# Patient Record
Sex: Male | Born: 1960
Health system: Southern US, Community
[De-identification: ages and names within clinical notes are randomized; demographics above are authoritative.]

## PROBLEM LIST (undated history)

## (undated) DIAGNOSIS — Z789 Other specified health status: Secondary | ICD-10-CM

## (undated) HISTORY — PX: NO PAST SURGERIES: SHX2092

---

## 2007-03-18 ENCOUNTER — Emergency Department: Payer: Self-pay | Admitting: Internal Medicine

## 2007-03-24 ENCOUNTER — Emergency Department: Payer: Self-pay

## 2008-08-07 ENCOUNTER — Ambulatory Visit: Payer: Self-pay | Admitting: Family Medicine

## 2009-07-16 IMAGING — CR DG LUMBAR SPINE 2-3V
1 series · 3 of 3 positions shown · non-contrast
Comparison: none

REASON FOR EXAM: pain
COMMENTS:

[Series 2: view not recorded · 0.17mm/px · 3 of 3 slices shown]
[im 1/3]
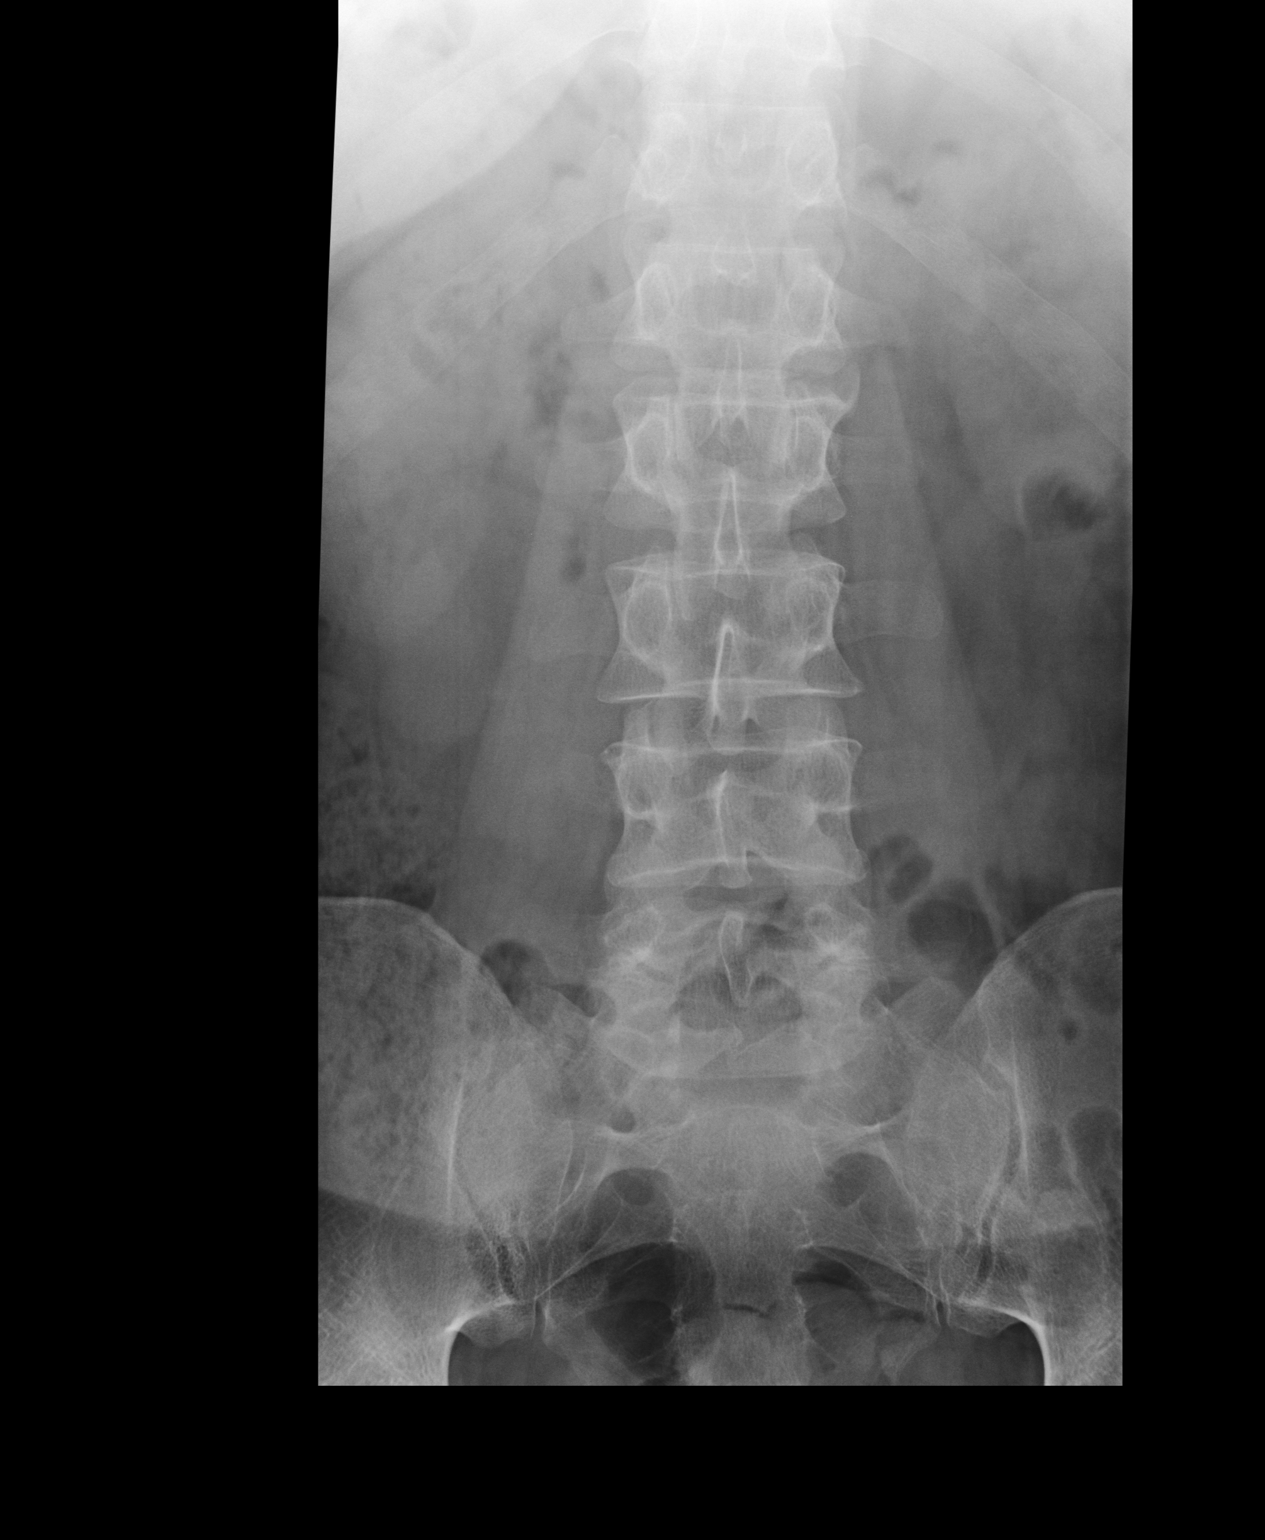
[im 2/3]
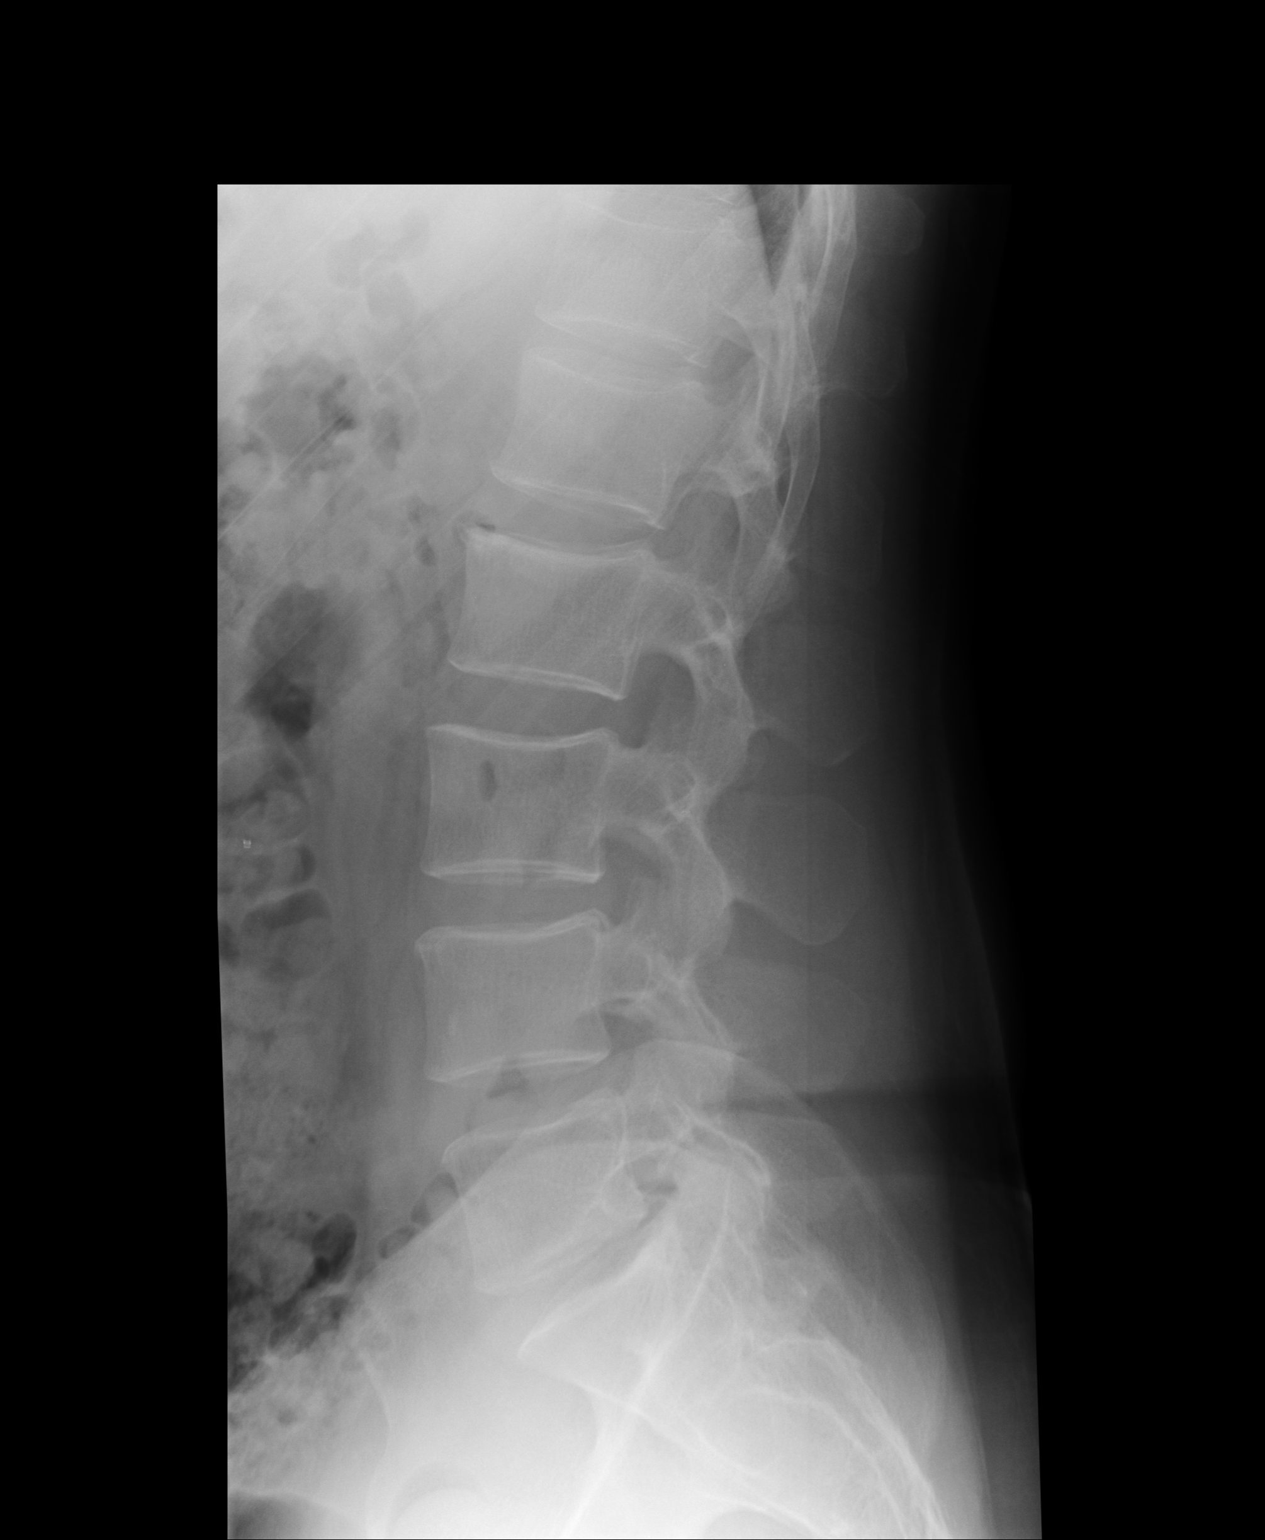
[im 3/3]
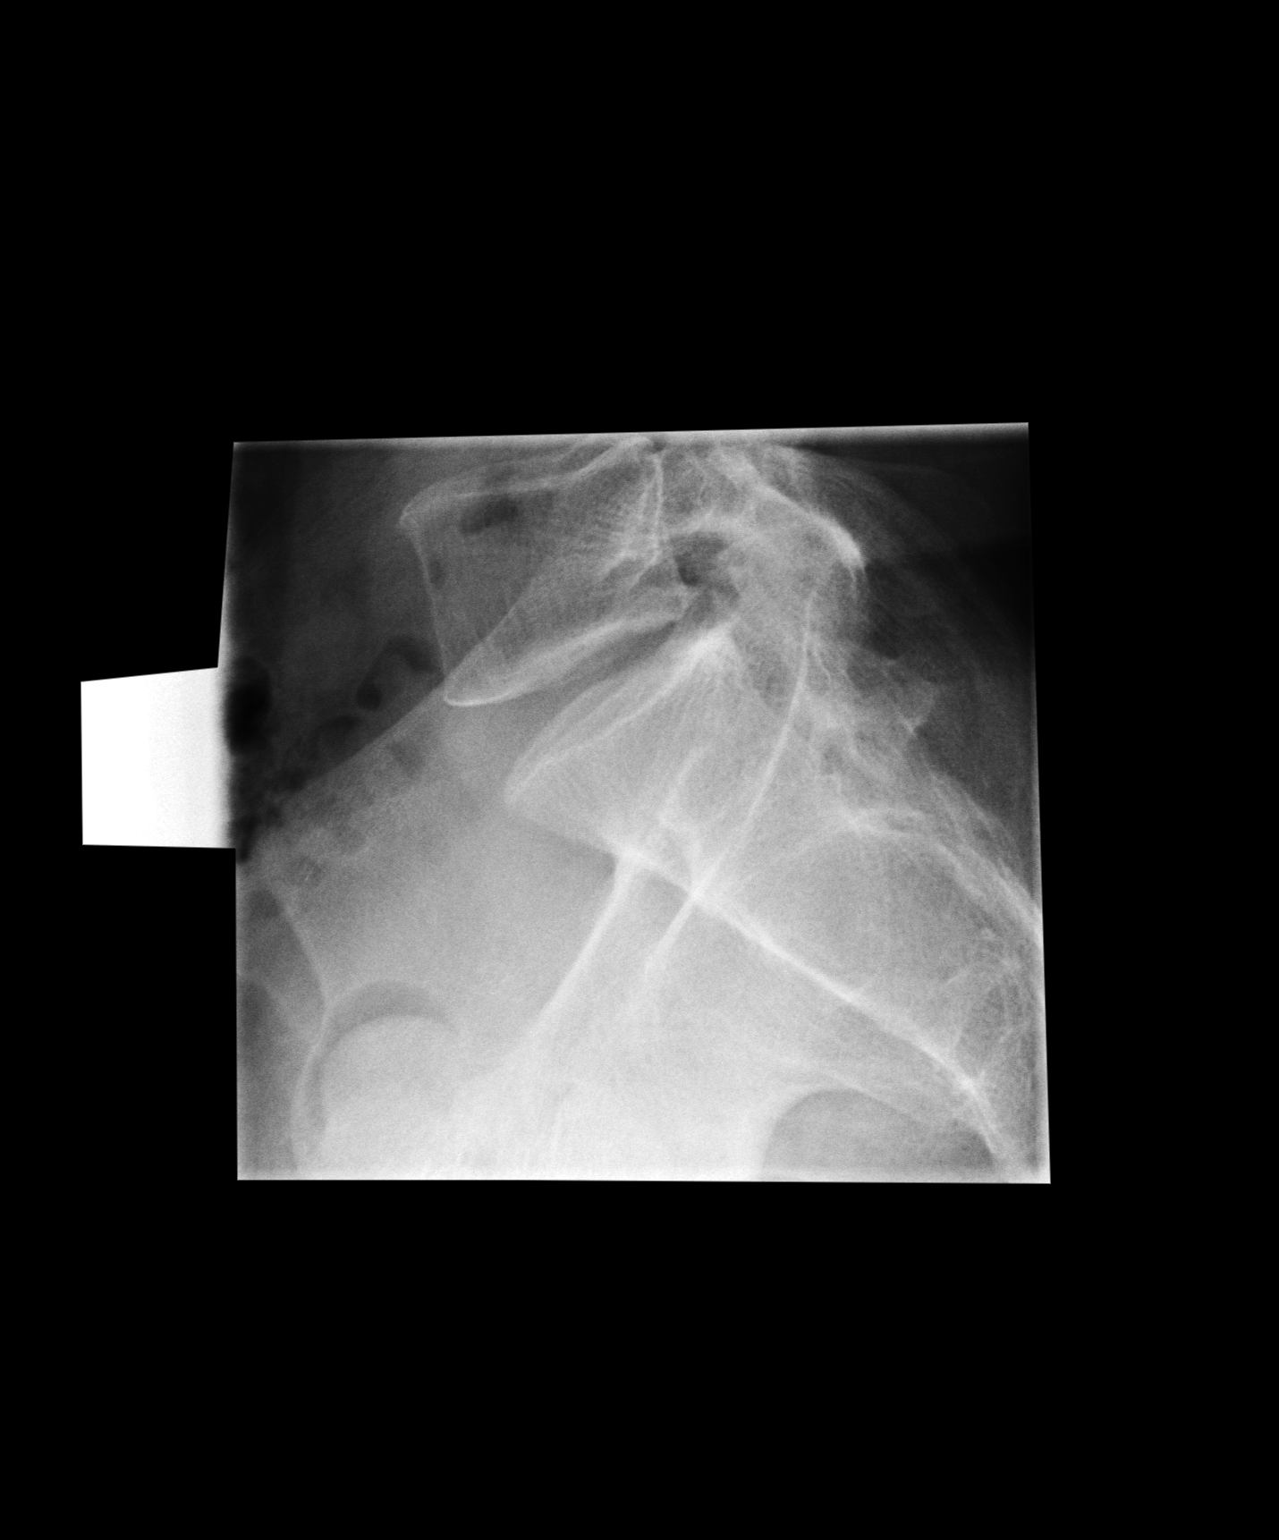

[3 of 3 positions shown; findings below may reference images not displayed]

PROCEDURE:     KDR - KDXR LUMBAR SPINE AP AND LATERAL  - August 07, 2008 [DATE]

RESULT:     The vertebral body heights and the intervertebral disc spaces
are well-maintained. The vertebral alignment is normal. The pedicles are
bilaterally intact. No lytic or blastic lesions are seen. There is slight
degenerative spurring anteriorly at L2.
IMPRESSION: 1. No acute changes are identified.

## 2014-03-30 LAB — LIPID PANEL
Cholesterol: 235 mg/dL — AB (ref 0–200)
HDL: 35 mg/dL (ref 35–70)
LDL Cholesterol: 170 mg/dL
Triglycerides: 152 mg/dL (ref 40–160)

## 2014-03-30 LAB — BASIC METABOLIC PANEL
BUN: 14 mg/dL (ref 4–21)
CREATININE: 1.2 mg/dL (ref <=1.3)
Glucose: 81 mg/dL
POTASSIUM: 4.3 mmol/L (ref 3.4–5.3)
Sodium: 143 mmol/L (ref 137–147)

## 2015-10-13 ENCOUNTER — Encounter: Payer: Self-pay | Admitting: Emergency Medicine

## 2015-10-13 ENCOUNTER — Emergency Department
Admission: EM | Admit: 2015-10-13 | Discharge: 2015-10-13 | Disposition: A | Discharge status: Home / Self Care | Payer: Worker's Compensation | Attending: Emergency Medicine | Admitting: Emergency Medicine

## 2015-10-13 DIAGNOSIS — S61012A Laceration without foreign body of left thumb without damage to nail, initial encounter: Secondary | ICD-10-CM | POA: Diagnosis not present

## 2015-10-13 DIAGNOSIS — W231XXA Caught, crushed, jammed, or pinched between stationary objects, initial encounter: Secondary | ICD-10-CM | POA: Diagnosis not present

## 2015-10-13 DIAGNOSIS — Y9289 Other specified places as the place of occurrence of the external cause: Secondary | ICD-10-CM | POA: Insufficient documentation

## 2015-10-13 DIAGNOSIS — Y990 Civilian activity done for income or pay: Secondary | ICD-10-CM | POA: Insufficient documentation

## 2015-10-13 DIAGNOSIS — Y9389 Activity, other specified: Secondary | ICD-10-CM | POA: Insufficient documentation

## 2015-10-13 MED ORDER — CEPHALEXIN 500 MG PO CAPS: 500.0000 mg | ORAL_CAPSULE | Freq: Three times a day (TID) | ORAL | Status: DC

## 2015-10-13 NOTE — Discharge Instructions (Signed)
Stitches, Staples, or Adhesive Wound Closure  °Health care providers use stitches (sutures), staples, and certain glue (skin adhesives) to hold skin together while it heals (wound closure). You may need this treatment after you have surgery or if you cut your skin accidentally. These methods help your skin to heal more quickly and make it less likely that you will have a scar. A wound may take several months to heal completely.  °The type of wound you have determines when your wound gets closed. In most cases, the wound is closed as soon as possible (primary skin closure). Sometimes, closure is delayed so the wound can be cleaned and allowed to heal naturally. This reduces the chance of infection. Delayed closure may be needed if your wound:  °Is caused by a bite.  °Happened more than 6 hours ago.  °Involves loss of skin or the tissues under the skin.  °Has dirt or debris in it that cannot be removed.  °Is infected. °WHAT ARE THE DIFFERENT KINDS OF WOUND CLOSURES?  °There are many options for wound closure. The one that your health care provider uses depends on how deep and how large your wound is.  °Adhesive Glue  °To use this type of glue to close a wound, your health care provider holds the edges of the wound together and paints the glue on the surface of your skin. You may need more than one layer of glue. Then the wound may be covered with a light bandage (dressing).  °This type of skin closure may be used for small wounds that are not deep (superficial). Using glue for wound closure is less painful than other methods. It does not require a medicine that numbs the area (local anesthetic). This method also leaves nothing to be removed. Adhesive glue is often used for children and on facial wounds.  °Adhesive glue cannot be used for wounds that are deep, uneven, or bleeding. It is not used inside of a wound.  °Adhesive Strips  °These strips are made of sticky (adhesive), porous paper. They are applied across your  skin edges like a regular adhesive bandage. You leave them on until they fall off.  °Adhesive strips may be used to close very superficial wounds. They may also be used along with sutures to improve the closure of your skin edges.  °Sutures  °Sutures are the oldest method of wound closure. Sutures can be made from natural substances, such as silk, or from synthetic materials, such as nylon and steel. They can be made from a material that your body can break down as your wound heals (absorbable), or they can be made from a material that needs to be removed from your skin (nonabsorbable). They come in many different strengths and sizes.  °Your health care provider attaches the sutures to a steel needle on one end. Sutures can be passed through your skin, or through the tissues beneath your skin. Then they are tied and cut. Your skin edges may be closed in one continuous stitch or in separate stitches.  °Sutures are strong and can be used for all kinds of wounds. Absorbable sutures may be used to close tissues under the skin. The disadvantage of sutures is that they may cause skin reactions that lead to infection. Nonabsorbable sutures need to be removed.  °Staples  °When surgical staples are used to close a wound, the edges of your skin on both sides of the wound are brought close together. A staple is placed across the wound, and   an instrument secures the edges together. Staples are often used to close surgical cuts (incisions).  °Staples are faster to use than sutures, and they cause less skin reaction. Staples need to be removed using a tool that bends the staples away from your skin.  °HOW DO I CARE FOR MY WOUND CLOSURE?  °Take medicines only as directed by your health care provider.  °If you were prescribed an antibiotic medicine for your wound, finish it all even if you start to feel better.  °Use ointments or creams only as directed by your health care provider.  °Wash your hands with soap and water before and  after touching your wound.  °Do not soak your wound in water. Do not take baths, swim, or use a hot tub until your health care provider approves.  °Ask your health care provider when you can start showering. Cover your wound if directed by your health care provider.  °Do not take out your own sutures or staples.  °Do not pick at your wound. Picking can cause an infection.  °Keep all follow-up visits as directed by your health care provider. This is important. °HOW LONG WILL I HAVE MY WOUND CLOSURE?  °Leave adhesive glue on your skin until the glue peels away.  °Leave adhesive strips on your skin until the strips fall off.  °Absorbable sutures will dissolve within several days.  °Nonabsorbable sutures and staples must be removed. The location of the wound will determine how long they stay in. This can range from several days to a couple of weeks. °WHEN SHOULD I SEEK HELP FOR MY WOUND CLOSURE?  °Contact your health care provider if:  °You have a fever.  °You have chills.  °You have drainage, redness, swelling, or pain at your wound.  °There is a bad smell coming from your wound.  °The skin edges of your wound start to separate after your sutures have been removed.  °Your wound becomes thick, raised, and darker in color after your sutures come out (scarring). °This information is not intended to replace advice given to you by your health care provider. Make sure you discuss any questions you have with your health care provider.  °Document Released: 04/22/2001 Document Revised: 08/18/2014 Document Reviewed: 01/04/2014  °Elsevier Interactive Patient Education ©2016 Elsevier Inc[employer]  ° °

## 2015-10-13 NOTE — ED Provider Notes (Signed)
[facility] Emergency Department Provider Note  ____________________________________________  Time seen: Approximately 3:43 PM  I have reviewed the triage vital signs and the nursing notes.   HISTORY  Chief Complaint Laceration    HPI Kevin Terry is a 55 y.o. male , NAD, reports to the emergency department after smashing his left thumb in a new machine while at work. States she lacerated the tip of his thumb. States he immediately washed the area and irrigated with water while working. Has applied dressing and has been applying pressure. Notes his last tetanus was within the last 4 years. Also states he would prefer glue or Steri-Strips to be applied as he has a high likelihood of passing out with any needles or suturing. Also notes the same with increased amounts of blood. Denies any wheezing S or dizziness at this time. On this, weakness, tingling of the left thumb.   History reviewed. No pertinent past medical history.  There are no active problems to display for this patient.   History reviewed. No pertinent past surgical history.  Current Outpatient Rx  Name  Route  Sig  Dispense  Refill  . cephALEXin (KEFLEX) 500 MG capsule   Oral   Take 1 capsule (500 mg total) by mouth 3 (three) times daily.   21 capsule   0     Allergies Review of patient's allergies indicates no known allergies.  No family history on file.  Social History Social History  Substance Use Topics  . Smoking status: Never Smoker   . Smokeless tobacco: None  . Alcohol Use: No     Review of Systems  Constitutional: No fever/chills Eyes: No visual changes.  Cardiovascular: No chest pain. Respiratory: No shortness of breath. No wheezing.  Gastrointestinal: No abdominal pain.  No nausea, vomiting.   Musculoskeletal: Pain about the left thumb at the laceration site. Specified musculoskeletal pain. Skin: Positive laceration left thumb. Negative for rash,  bruising. Neurological: Negative for headaches, focal weakness or numbness. 10-point ROS otherwise negative.  ____________________________________________   PHYSICAL EXAM:  VITAL SIGNS: ED Triage Vitals  Enc Vitals Group     BP 10/13/15 1440 122/87 mmHg     Pulse Rate 10/13/15 1440 72     Resp 10/13/15 1440 18     Temp 10/13/15 1440 97.8 F (36.6 C)     Temp src --      SpO2 10/13/15 1440 97 %     Weight 10/13/15 1440 185 lb (83.915 kg)     Height 10/13/15 1440 6' (1.829 m)     Head Cir --      Peak Flow --      Pain Score 10/13/15 1441 10     Pain Loc --      Pain Edu? --      Excl. in [place]? --     Constitutional: Alert and oriented. Well appearing and in no acute distress. Eyes: Conjunctivae are normal.  Head: Atraumatic. Cardiovascular:Good peripheral circulation. Respiratory: Normal respiratory effort without tachypnea or retractions.  Musculoskeletal: No pain to palpation about the left thumb or hand. No joint tenderness. Full range of motion of left thumb. Neurologic:  Normal speech and language. No gross focal neurologic deficits are appreciated.  Skin:  1.5cm, clean, linear laceration to the pad of the left thumb. Skin is warm, dry and intact. No rash noted. Psychiatric: Mood and affect are normal. Speech and behavior are normal. Patient exhibits appropriate insight and judgement.   ____________________________________________  LABS  None  ____________________________________________  EKG  None ____________________________________________  RADIOLOGY  None ____________________________________________    PROCEDURES  Procedure(s) performed: LACERATION REPAIR Performed by: Braxton Feathers Authorized by: Braxton Feathers Consent: Verbal consent obtained. Risks and benefits: risks, benefits and alternatives were discussed Consent given by: patient Patient identity confirmed: provided demographic data Prepped and Draped in normal sterile fashion Wound  explored  Laceration Location: pad of left thumb  Laceration Length: 1.5cm  No Foreign Bodies seen or palpated  Anesthesia: local infiltration  Local anesthetic: none  Anesthetic total: NA  Irrigation method: soak with manipulation (patient would not tolerate syringe irrigation) Amount of cleaning: standard  Skin closure: steri strips with compound tincture  Number of sutures: 3  Technique: NA  Patient tolerance: Patient tolerated the procedure well with no immediate complications.    Medications - No data to display   ____________________________________________   INITIAL IMPRESSION / ASSESSMENT AND PLAN / ED COURSE  Patient's diagnosis is consistent with laceration of left thumb. Patient will be discharged home with prescriptions for Keflex to take as prescribed. May take Tylenol as needed for pain. Work note given to excuse from work for the next 48 hours to allow Steri-Strips to adhere properly and wound to begin healing. Patient is to follow up with primary care provider or medical facility as deemed by Worker's Comp. for wound check in 2 days.  Patient is given ED precautions to return to the ED for any worsening or new symptoms.    ____________________________________________  FINAL CLINICAL IMPRESSION(S) / ED DIAGNOSES  Final diagnoses:  Laceration of left thumb, initial encounter      NEW MEDICATIONS STARTED DURING THIS VISIT:  Discharge Medication List as of 10/13/2015  4:12 PM    START taking these medications   Details  cephALEXin (KEFLEX) 500 MG capsule Take 1 capsule (500 mg total) by mouth 3 (three) times daily., Starting 10/13/2015, Until Discontinued, [place], PA-C 10/13/15 1939  Carrie Mew, MD 10/14/15 2250

## 2015-10-13 NOTE — ED Notes (Signed)
Smashed L thumb approx 1330 at work.

## 2015-10-13 NOTE — ED Notes (Signed)
Pt in for left thumb lac - workers comp. Pa in with pt for assessment, pt requesting not to have sutures, states he passes out easily

## 2015-10-13 NOTE — ED Notes (Signed)
Discussed discharge instructions, prescriptions, and follow-up care with patient. No questions or concerns at this time. Pt stable at discharge.  

## 2016-02-04 DIAGNOSIS — Z8249 Family history of ischemic heart disease and other diseases of the circulatory system: Secondary | ICD-10-CM | POA: Insufficient documentation

## 2016-08-07 DIAGNOSIS — H5212 Myopia, left eye: Secondary | ICD-10-CM | POA: Diagnosis not present

## 2016-12-22 DIAGNOSIS — M79672 Pain in left foot: Secondary | ICD-10-CM | POA: Diagnosis not present

## 2016-12-22 DIAGNOSIS — M722 Plantar fascial fibromatosis: Secondary | ICD-10-CM | POA: Diagnosis not present

## 2017-04-28 DIAGNOSIS — M722 Plantar fascial fibromatosis: Secondary | ICD-10-CM | POA: Diagnosis not present

## 2018-05-18 ENCOUNTER — Ambulatory Visit (INDEPENDENT_AMBULATORY_CARE_PROVIDER_SITE_OTHER): Payer: BLUE CROSS/BLUE SHIELD | Admitting: Family Medicine

## 2018-05-18 ENCOUNTER — Other Ambulatory Visit: Payer: Self-pay

## 2018-05-18 ENCOUNTER — Encounter: Payer: Self-pay | Admitting: Family Medicine

## 2018-05-18 VITALS — BP 122/78 | HR 66 | Temp 97.9°F | Resp 16 | Ht 69.0 in | Wt 188.4 lb

## 2018-05-18 DIAGNOSIS — Z1211 Encounter for screening for malignant neoplasm of colon: Secondary | ICD-10-CM | POA: Diagnosis not present

## 2018-05-18 DIAGNOSIS — Z125 Encounter for screening for malignant neoplasm of prostate: Secondary | ICD-10-CM

## 2018-05-18 DIAGNOSIS — Z87442 Personal history of urinary calculi: Secondary | ICD-10-CM

## 2018-05-18 DIAGNOSIS — M722 Plantar fascial fibromatosis: Secondary | ICD-10-CM

## 2018-05-18 DIAGNOSIS — Z0000 Encounter for general adult medical examination without abnormal findings: Secondary | ICD-10-CM | POA: Diagnosis not present

## 2018-05-18 NOTE — Patient Instructions (Signed)
We will call you about the lab results and the colonoscopy referral.

## 2018-05-18 NOTE — Progress Notes (Signed)
  Subjective:     Patient ID: Kevin Terry, male   DOB: 1961/03/17, 57 y.o.   MRN: [id number] Chief Complaint  Patient presents with  . Annual Exam    Patient comes in office today for his annual physical, in general he states that he feels well today and has no concerns to address. Patient reports following in "general" a healthy diet, he is not actively exercising and states that he sleeps on average 6hrs a day.    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 Has seen dermatology in the past for a skin survey and podiatry for plantar fasciitis. Defers flu vaccine.  Review of Systems General: Feeling well HEENT: regular dental visits and eye exams (reading glasses) Cardiovascular: no chest pain, shortness of breath, or palpitations GI: occasional reflux he treats with Prevacid. No change in bowel habits or blood in the stool. Wishes to schedule a colonoscopy. GU: nocturia x 0, no change in bladder habits  Psychiatric: not depressed Musculoskeletal: no joint pain. Shoe inserts control his plantar fasciitis.    Objective:   Physical Exam  Constitutional: He appears well-developed and well-nourished. No distress.  Eyes: PERRLA, EOMI Neck: no thyromegaly, tenderness or nodules, no cervical adenopathy or carotid bruits. ENT: TM's intact without inflammation; No tonsillar enlargement or exudate, Lungs: Clear Heart : RRR without murmur or gallop Abd: bowel sounds present, soft, non-tender, no organomegaly Rectal: Prostate firm and non-tender Extremities: no edema Skin: no atypical lesions noted on his back     Assessment:    1. Screening for colon cancer - Ambulatory referral to Gastroenterology  2. Annual physical exam - Lipid panel - Comprehensive metabolic panel  3. History of kidney stones  4. Plantar fasciitis, bilateral  5. Screening for prostate cancer - PSA    Plan:    Further f/u pending lab results.

## 2018-05-19 LAB — COMPREHENSIVE METABOLIC PANEL
ALBUMIN: 4.3 g/dL (ref 3.5–5.5)
ALK PHOS: 77 IU/L (ref 39–117)
ALT: 14 IU/L (ref 0–44)
AST: 13 IU/L (ref 0–40)
Albumin/Globulin Ratio: 1.8 (ref 1.2–2.2)
BILIRUBIN TOTAL: 0.5 mg/dL (ref 0.0–1.2)
BUN / CREAT RATIO: 14 (ref 9–20)
BUN: 15 mg/dL (ref 6–24)
CHLORIDE: 104 mmol/L (ref 96–106)
CO2: 23 mmol/L (ref 20–29)
Calcium: 9.5 mg/dL (ref 8.7–10.2)
Creatinine, Ser: 1.11 mg/dL (ref 0.76–1.27)
GFR calc Af Amer: 84 mL/min/{1.73_m2} (ref >59)
GFR calc non Af Amer: 73 mL/min/{1.73_m2} (ref >59)
GLUCOSE: 89 mg/dL (ref 65–99)
Globulin, Total: 2.4 g/dL (ref 1.5–4.5)
POTASSIUM: 4.4 mmol/L (ref 3.5–5.2)
Sodium: 143 mmol/L (ref 134–144)
Total Protein: 6.7 g/dL (ref 6.0–8.5)

## 2018-05-19 LAB — LIPID PANEL
CHOLESTEROL TOTAL: 207 mg/dL — AB (ref 100–199)
Chol/HDL Ratio: 5.9 ratio — ABNORMAL HIGH (ref 0.0–5.0)
HDL: 35 mg/dL — ABNORMAL LOW (ref >39)
LDL CALC: 154 mg/dL — AB (ref 0–99)
Triglycerides: 89 mg/dL (ref 0–149)
VLDL CHOLESTEROL CAL: 18 mg/dL (ref 5–40)

## 2018-05-19 LAB — PSA: PROSTATE SPECIFIC AG, SERUM: 0.9 ng/mL (ref 0.0–4.0)

## 2018-05-27 ENCOUNTER — Other Ambulatory Visit: Payer: Self-pay

## 2018-05-27 ENCOUNTER — Encounter: Payer: Self-pay | Admitting: *Deleted

## 2018-06-04 NOTE — Discharge Instructions (Signed)
General Anesthesia, Adult, Care After °These instructions provide you with information about caring for yourself after your procedure. Your health care provider may also give you more specific instructions. Your treatment has been planned according to current medical practices, but problems sometimes occur. Call your health care provider if you have any problems or questions after your procedure. °What can I expect after the procedure? °After the procedure, it is common to have: °· Vomiting. °· A sore throat. °· Mental slowness. ° °It is common to feel: °· Nauseous. °· Cold or shivery. °· Sleepy. °· Tired. °· Sore or achy, even in parts of your body where you did not have surgery. ° °Follow these instructions at home: °For at least 24 hours after the procedure: °· Do not: °? Participate in activities where you could fall or become injured. °? Drive. °? Use heavy machinery. °? Drink alcohol. °? Take sleeping pills or medicines that cause drowsiness. °? Make important decisions or sign legal documents. °? Take care of children on your own. °· Rest. °Eating and drinking °· If you vomit, drink water, juice, or soup when you can drink without vomiting. °· Drink enough fluid to keep your urine clear or pale yellow. °· Make sure you have little or no nausea before eating solid foods. °· Follow the diet recommended by your health care provider. °General instructions °· Have a responsible adult stay with you until you are awake and alert. °· Return to your normal activities as told by your health care provider. Ask your health care provider what activities are safe for you. °· Take over-the-counter and prescription medicines only as told by your health care provider. °· If you smoke, do not smoke without supervision. °· Keep all follow-up visits as told by your health care provider. This is important. °Contact a health care provider if: °· You continue to have nausea or vomiting at home, and medicines are not helpful. °· You  cannot drink fluids or start eating again. °· You cannot urinate after 8-12 hours. °· You develop a skin rash. °· You have fever. °· You have increasing redness at the site of your procedure. °Get help right away if: °· You have difficulty breathing. °· You have chest pain. °· You have unexpected bleeding. °· You feel that you are having a life-threatening or urgent problem. °This information is not intended to replace advice given to you by your health care provider. Make sure you discuss any questions you have with your health care provider. °Document Released: 11/03/2000 Document Revised: 12/31/2015 Document Reviewed: 07/12/2015 °Elsevier Interactive Patient Education © 2018 Elsevier Inc. ° °

## 2018-06-07 ENCOUNTER — Ambulatory Visit: Payer: BLUE CROSS/BLUE SHIELD | Admitting: Anesthesiology

## 2018-06-07 ENCOUNTER — Encounter
Admission: RE | Disposition: A | Discharge status: Home / Self Care | Payer: Self-pay | Source: Ambulatory Visit | Attending: Gastroenterology

## 2018-06-07 ENCOUNTER — Ambulatory Visit
Admission: RE | Admit: 2018-06-07 | Discharge: 2018-06-07 | Disposition: A | Discharge status: Home / Self Care | Payer: BLUE CROSS/BLUE SHIELD | Source: Ambulatory Visit | Attending: Gastroenterology | Admitting: Gastroenterology

## 2018-06-07 DIAGNOSIS — Z1211 Encounter for screening for malignant neoplasm of colon: Secondary | ICD-10-CM | POA: Diagnosis not present

## 2018-06-07 DIAGNOSIS — K640 First degree hemorrhoids: Secondary | ICD-10-CM | POA: Insufficient documentation

## 2018-06-07 DIAGNOSIS — D125 Benign neoplasm of sigmoid colon: Secondary | ICD-10-CM | POA: Diagnosis not present

## 2018-06-07 DIAGNOSIS — K635 Polyp of colon: Secondary | ICD-10-CM

## 2018-06-07 DIAGNOSIS — D122 Benign neoplasm of ascending colon: Secondary | ICD-10-CM | POA: Diagnosis not present

## 2018-06-07 HISTORY — DX: Other specified health status: Z78.9

## 2018-06-07 HISTORY — PX: COLONOSCOPY WITH PROPOFOL: SHX5780

## 2018-06-07 HISTORY — PX: POLYPECTOMY: SHX5525

## 2018-06-07 SURGERY — COLONOSCOPY WITH PROPOFOL
Anesthesia: General | Site: Rectum

## 2018-06-07 MED ORDER — SODIUM CHLORIDE 0.9 % IV SOLN: INTRAVENOUS | Status: DC

## 2018-06-07 MED ORDER — STERILE WATER FOR IRRIGATION IR SOLN
Status: DC | PRN
  Administered 2018-06-07: 08:00:00

## 2018-06-07 MED ORDER — LIDOCAINE HCL (CARDIAC) PF 100 MG/5ML IV SOSY
PREFILLED_SYRINGE | INTRAVENOUS | Status: DC | PRN
  Administered 2018-06-07: 40 mg via INTRAVENOUS

## 2018-06-07 MED ORDER — PROPOFOL 10 MG/ML IV BOLUS
INTRAVENOUS | Status: DC | PRN
  Administered 2018-06-07: 30 mg via INTRAVENOUS
  Administered 2018-06-07 (×3): 20 mg via INTRAVENOUS
  Administered 2018-06-07: 100 mg via INTRAVENOUS
  Administered 2018-06-07: 30 mg via INTRAVENOUS

## 2018-06-07 MED ORDER — ONDANSETRON HCL 4 MG/2ML IJ SOLN: 4.0000 mg | Freq: Once | INTRAMUSCULAR | Status: DC | PRN

## 2018-06-07 MED ORDER — LACTATED RINGERS IV SOLN
10.0000 mL/h | INTRAVENOUS | Status: DC
  Administered 2018-06-07: 10 mL/h via INTRAVENOUS

## 2018-06-07 SURGICAL SUPPLY — 7 items
CANISTER SUCT 1200ML W/VALVE (MISCELLANEOUS) ×2 IMPLANT
GOWN CVR UNV OPN BCK APRN NK (MISCELLANEOUS) ×2 IMPLANT
GOWN ISOL THUMB LOOP REG UNIV (MISCELLANEOUS) ×2
KIT ENDO PROCEDURE OLY (KITS) ×2 IMPLANT
SNARE SHORT THROW 13M SML OVAL (MISCELLANEOUS) ×2 IMPLANT
TRAP ETRAP POLY (MISCELLANEOUS) ×2 IMPLANT
WATER STERILE IRR 250ML POUR (IV SOLUTION) ×2 IMPLANT

## 2018-06-07 NOTE — H&P (Signed)
Lucilla Lame, MD [facility] [address] [place], [place] [address] Phone: [phone] Fax : [phone]  Primary Care Physician:  Carmon Ginsberg, [place] Primary Gastroenterologist:  Dr. Allen Norris  Pre-Procedure History & Physical: HPI:  Kevin Terry is a 57 y.o. male is here for a screening colonoscopy.   Past Medical History:  Diagnosis Date  . Medical history non-contributory     Past Surgical History:  Procedure Laterality Date  . NO PAST SURGERIES      Prior to Admission medications   Not on File    Allergies as of 05/18/2018  . (No Known Allergies)    Family History  Problem Relation Age of Onset  . Alzheimer's disease Mother   . Parkinson's disease Father   . Heart disease Father   . Obesity Brother     Social History   Socioeconomic History  . Marital status: Married    Spouse name: Not on file  . Number of children: Not on file  . Years of education: Not on file  . Highest education level: Not on file  Occupational History  . Not on file  Social Needs  . Financial resource strain: Not on file  . Food insecurity:    Worry: Not on file    Inability: Not on file  . Transportation needs:    Medical: Not on file    Non-medical: Not on file  Tobacco Use  . Smoking status: Never Smoker  . Smokeless tobacco: Never Used  . Tobacco comment: "tried" as a teenager  Substance and Sexual Activity  . Alcohol use: No    Alcohol/week: 0.0 standard drinks  . Drug use: No  . Sexual activity: Not on file  Lifestyle  . Physical activity:    Days per week: Not on file    Minutes per session: Not on file  . Stress: Not on file  Relationships  . Social connections:    Talks on phone: Not on file    Gets together: Not on file    Attends religious service: Not on file    Active member of club or organization: Not on file    Attends meetings of clubs or organizations: Not on file    Relationship status: Not on file  . Intimate partner violence:   Fear of current or ex partner: Not on file    Emotionally abused: Not on file    Physically abused: Not on file    Forced sexual activity: Not on file  Other Topics Concern  . Not on file  Social History Narrative  . Not on file    Review of Systems: See HPI, otherwise negative ROS  Physical Exam: BP 112/85   Pulse 91   Temp 97.9 F (36.6 C) (Temporal)   Resp 16   Ht 5\' 9"  (1.753 m)   Wt 82.6 kg   SpO2 98%   BMI 26.88 kg/m  General:   Alert,  pleasant and cooperative in NAD Head:  Normocephalic and atraumatic. Neck:  Supple; no masses or thyromegaly. Lungs:  Clear throughout to auscultation.    Heart:  Regular rate and rhythm. Abdomen:  Soft, nontender and nondistended. Normal bowel sounds, without guarding, and without rebound.   Neurologic:  Alert and  oriented x4;  grossly normal neurologically.  Impression/Plan: Kevin Terry is now here to undergo a screening colonoscopy.  Risks, benefits, and alternatives regarding colonoscopy have been reviewed with the patient.  Questions have been answered.  All parties agreeable.

## 2018-06-07 NOTE — Anesthesia Postprocedure Evaluation (Signed)
Anesthesia Post Note  Patient: Kevin Terry  Procedure(s) Performed: COLONOSCOPY WITH BIOPSIES (N/A Rectum) POLYPECTOMY (N/A Rectum)  Patient location during evaluation: PACU Anesthesia Type: General Level of consciousness: awake Pain management: pain level controlled Vital Signs Assessment: post-procedure vital signs reviewed and stable Respiratory status: respiratory function stable Cardiovascular status: stable Postop Assessment: no signs of nausea or vomiting Anesthetic complications: no    Veda Canning

## 2018-06-07 NOTE — Transfer of Care (Signed)
Immediate Anesthesia Transfer of Care Note  Patient: Kevin Terry  Procedure(s) Performed: COLONOSCOPY WITH BIOPSIES (N/A Rectum) POLYPECTOMY (N/A Rectum)  Patient Location: PACU  Anesthesia Type: General  Level of Consciousness: awake, alert  and patient cooperative  Airway and Oxygen Therapy: Patient Spontanous Breathing and Patient connected to supplemental oxygen  Post-op Assessment: Post-op Vital signs reviewed, Patient's Cardiovascular Status Stable, Respiratory Function Stable, Patent Airway and No signs of Nausea or vomiting  Post-op Vital Signs: Reviewed and stable  Complications: No apparent anesthesia complications

## 2018-06-07 NOTE — Anesthesia Procedure Notes (Signed)
Procedure Name: MAC Date/Time: 06/07/2018 8:19 AM Performed by: Janna Arch, CRNA Pre-anesthesia Checklist: Patient identified, Emergency Drugs available, Suction available, Timeout performed and Patient being monitored Patient Re-evaluated:Patient Re-evaluated prior to induction Oxygen Delivery Method: Nasal cannula Placement Confirmation: positive ETCO2

## 2018-06-07 NOTE — Anesthesia Preprocedure Evaluation (Signed)
Anesthesia Evaluation  Patient identified by MRN, date of birth, ID band Patient awake    Airway Mallampati: II  TM Distance: >3 FB     Dental   Pulmonary neg pulmonary ROS,    breath sounds clear to auscultation       Cardiovascular negative cardio ROS   Rhythm:Regular Rate:Normal     Neuro/Psych    GI/Hepatic negative GI ROS,   Endo/Other  negative endocrine ROS  Renal/GU      Musculoskeletal   Abdominal   Peds  Hematology   Anesthesia Other Findings   Reproductive/Obstetrics                            Anesthesia Physical Anesthesia Plan  ASA: I  Anesthesia Plan: General   Post-op Pain Management:    Induction: Intravenous  PONV Risk Score and Plan:   Airway Management Planned: Natural Airway and Simple Face Mask  Additional Equipment:   Intra-op Plan:   Post-operative Plan:   Informed Consent: I have reviewed the patients History and Physical, chart, labs and discussed the procedure including the risks, benefits and alternatives for the proposed anesthesia with the patient or authorized representative who has indicated his/her understanding and acceptance.     Plan Discussed with: CRNA  Anesthesia Plan Comments:         Anesthesia Quick Evaluation

## 2018-06-07 NOTE — Op Note (Signed)
[facility] Gastroenterology Patient Name: Kevin Terry Procedure Date: 06/07/2018 8:17 AM MRN: [id number] Account #: [id number] Date of Birth: 09-09-1960 Admit Type: Outpatient Age: 57 Room: [facility] OR ROOM 01 Gender: Male Note Status: Finalized Procedure:            Colonoscopy Indications:          Screening for colorectal malignant neoplasm Providers:            Lucilla Lame MD, MD Referring MD:         Rose Fillers. Jaynie Crumble, MD (Referring MD) Medicines:            Propofol per Anesthesia Complications:        No immediate complications. Procedure:            Pre-Anesthesia Assessment:                       - Prior to the procedure, a History and Physical was                        performed, and patient medications and allergies were                        reviewed. The patient's tolerance of previous                        anesthesia was also reviewed. The risks and benefits of                        the procedure and the sedation options and risks were                        discussed with the patient. All questions were                        answered, and informed consent was obtained. Prior                        Anticoagulants: The patient has taken no previous                        anticoagulant or antiplatelet agents. ASA Grade                        Assessment: II - A patient with mild systemic disease.                        After reviewing the risks and benefits, the patient was                        deemed in satisfactory condition to undergo the                        procedure.                       After obtaining informed consent, the colonoscope was                        passed under direct vision. Throughout the procedure,  the patient's blood pressure, pulse, and oxygen                        saturations were monitored continuously. The was                        introduced through the anus and advanced to the the                       cecum, identified by appendiceal orifice and ileocecal                        valve. The colonoscopy was performed without                        difficulty. The patient tolerated the procedure well.                        The quality of the bowel preparation was excellent. Findings:      The perianal and digital rectal examinations were normal.      A 7 mm polyp was found in the ascending colon. The polyp was sessile.       The polyp was removed with a cold snare. Resection and retrieval were       complete.      Four sessile polyps were found in the sigmoid colon. The polyps were 3       to 9 mm in size. These polyps were removed with a cold snare. Resection       and retrieval were complete.      Non-bleeding internal hemorrhoids were found during retroflexion. The       hemorrhoids were Grade I (internal hemorrhoids that do not prolapse). Impression:           - One 7 mm polyp in the ascending colon, removed with a                        cold snare. Resected and retrieved.                       - Four 3 to 9 mm polyps in the sigmoid colon, removed                        with a cold snare. Resected and retrieved.                       - Non-bleeding internal hemorrhoids. Recommendation:       - Discharge patient to home.                       - Resume previous diet.                       - Continue present medications.                       - Await pathology results.                       - Repeat colonoscopy in 5 years if polyp adenoma and 10  years if hyperplastic Procedure Code(s):    --- Professional ---                       [id number], Colonoscopy, flexible; with removal of tumor(s),                        polyp(s), or other lesion(s) by snare technique Diagnosis Code(s):    --- Professional ---                       Z12.11, Encounter for screening for malignant neoplasm                        of colon                       D12.2, Benign  neoplasm of ascending colon                       D12.5, Benign neoplasm of sigmoid colon CPT copyright 2018 American Medical Association. All rights reserved. The codes documented in this report are preliminary and upon coder review may  be revised to meet current compliance requirements. Lucilla Lame MD, MD 06/07/2018 8:39:49 AM This report has been signed electronically. Number of Addenda: 0 Note Initiated On: 06/07/2018 8:17 AM Scope Withdrawal Time: 0 hours 11 minutes 49 seconds  Total Procedure Duration: 0 hours 14 minutes 50 seconds       [facility]

## 2018-06-08 ENCOUNTER — Encounter: Payer: Self-pay | Admitting: Gastroenterology

## 2020-09-05 ENCOUNTER — Encounter: Payer: Self-pay | Admitting: Adult Health

## 2020-09-05 ENCOUNTER — Ambulatory Visit (INDEPENDENT_AMBULATORY_CARE_PROVIDER_SITE_OTHER): Payer: BC Managed Care – PPO | Admitting: Adult Health

## 2020-09-05 ENCOUNTER — Other Ambulatory Visit: Payer: Self-pay

## 2020-09-05 VITALS — BP 139/83 | HR 86 | Temp 97.6°F | Resp 16 | Ht 70.0 in | Wt 190.6 lb

## 2020-09-05 DIAGNOSIS — L409 Psoriasis, unspecified: Secondary | ICD-10-CM | POA: Diagnosis not present

## 2020-09-05 DIAGNOSIS — B369 Superficial mycosis, unspecified: Secondary | ICD-10-CM

## 2020-09-05 DIAGNOSIS — L853 Xerosis cutis: Secondary | ICD-10-CM | POA: Diagnosis not present

## 2020-09-05 MED ORDER — TRIAMCINOLONE ACETONIDE 0.1 % EX CREA: 1.0000 "application " | TOPICAL_CREAM | Freq: Two times a day (BID) | CUTANEOUS | 0 refills | Status: AC

## 2020-09-05 MED ORDER — NYSTATIN 100000 UNIT/GM EX OINT: 1.0000 "application " | TOPICAL_OINTMENT | Freq: Two times a day (BID) | CUTANEOUS | 0 refills | Status: AC

## 2020-09-05 NOTE — Progress Notes (Signed)
Established patient visit   Patient: Kevin Terry   DOB: [birth year]   60 y.o. Male  MRN: [id number] Visit Date: 09/05/2020  Today's healthcare provider: Marcille Buffy, FNP   Chief Complaint  Patient presents with  . Skin Problem    Patient presents in office today to address callus on both his hands that has been present for 2 weeks.    Subjective    HPI HPI    Skin Problem     Additional comments: Patient presents in office today to address callus on both his hands that has been present for 2 weeks.        Last edited by Minette Headland, CMA on 09/05/2020  2:06 PM. (History)      He has not tried any medications. He reports that dry skin has been present on his hands for the past 2 weeks, has cracking areas on fingers.   He does report he has arthritis. Joints ache in the morning and get better throughout the day.   He occasionally wears gloves, he is a meat cutter.   He denies any injury.   Patient  denies any fever, body aches,chills, rash, chest pain, shortness of breath, nausea, vomiting, or diarrhea.  Denies dizziness, lightheadedness, pre syncopal or syncopal episodes.  Denies any recent illness.     Patient Active Problem List   Diagnosis Date Noted  . Fungal infection of skin 09/05/2020  . Dry skin 09/05/2020  . Psoriasis 09/05/2020  . Encounter for screening colonoscopy   . Benign neoplasm of ascending colon   . Polyp of sigmoid colon   . History of kidney stones 05/18/2018  . Plantar fasciitis, bilateral 05/18/2018  . Fam hx-ischem heart disease 02/04/2016   Past Medical History:  Diagnosis Date  . Medical history non-contributory    Past Surgical History:  Procedure Laterality Date  . COLONOSCOPY WITH PROPOFOL N/A 06/07/2018   Procedure: COLONOSCOPY WITH BIOPSIES;  Surgeon: Lucilla Lame, MD;  Location: [place];  Service: Endoscopy;  Laterality: N/A;  . NO PAST SURGERIES    . POLYPECTOMY N/A 06/07/2018    Procedure: POLYPECTOMY;  Surgeon: Lucilla Lame, MD;  Location: [place];  Service: Endoscopy;  Laterality: N/A;   Social History   Tobacco Use  . Smoking status: Never Smoker  . Smokeless tobacco: Never Used  . Tobacco comment: "tried" as a teenager  Vaping Use  . Vaping Use: Never used  Substance Use Topics  . Alcohol use: No    Alcohol/week: 0.0 standard drinks  . Drug use: No   Social History   Socioeconomic History  . Marital status: Married    Spouse name: Not on file  . Number of children: Not on file  . Years of education: Not on file  . Highest education level: Not on file  Occupational History  . Not on file  Tobacco Use  . Smoking status: Never Smoker  . Smokeless tobacco: Never Used  . Tobacco comment: "tried" as a teenager  Vaping Use  . Vaping Use: Never used  Substance and Sexual Activity  . Alcohol use: No    Alcohol/week: 0.0 standard drinks  . Drug use: No  . Sexual activity: Not on file  Other Topics Concern  . Not on file  Social History Narrative  . Not on file   Social Determinants of Health   Financial Resource Strain: Not on file  Food Insecurity: Not on file  Transportation Needs: Not  on file  Physical Activity: Not on file  Stress: Not on file  Social Connections: Not on file  Intimate Partner Violence: Not on file   Family Status  Relation Name Status  . Mother  Alive  . Father  Deceased  . Brother  Alive  . Daughter  Alive  . Son  Alive   Family History  Problem Relation Age of Onset  . Alzheimer's disease Mother   . Parkinson's disease Father   . Heart disease Father   . Obesity Brother    No Known Allergies     Medications: No outpatient medications prior to visit.   No facility-administered medications prior to visit.    Review of Systems  Constitutional: Negative.   Respiratory: Negative.   Cardiovascular: Negative.   Genitourinary: Negative.   Musculoskeletal: Positive for arthralgias.  Skin:  Positive for rash.  Neurological: Negative.  Negative for dizziness.  Psychiatric/Behavioral: Negative.     Last CBC No results found for: WBC, HGB, HCT, MCV, MCH, RDW, PLT Last metabolic panel Lab Results  Component Value Date   GLUCOSE 89 05/18/2018   NA 143 05/18/2018   K 4.4 05/18/2018   CL 104 05/18/2018   CO2 23 05/18/2018   BUN 15 05/18/2018   CREATININE 1.11 05/18/2018   GFRNONAA 73 05/18/2018   GFRAA 84 05/18/2018   CALCIUM 9.5 05/18/2018   PROT 6.7 05/18/2018   ALBUMIN 4.3 05/18/2018   LABGLOB 2.4 05/18/2018   AGRATIO 1.8 05/18/2018   BILITOT 0.5 05/18/2018   ALKPHOS 77 05/18/2018   AST 13 05/18/2018   ALT 14 05/18/2018   Last lipids Lab Results  Component Value Date   CHOL 207 (H) 05/18/2018   HDL 35 (L) 05/18/2018   LDLCALC 154 (H) 05/18/2018   TRIG 89 05/18/2018   CHOLHDL 5.9 (H) 05/18/2018   Last hemoglobin A1c No results found for: HGBA1C Last thyroid functions No results found for: TSH, T3TOTAL, T4TOTAL, THYROIDAB Last vitamin D No results found for: 25OHVITD2, 25OHVITD3, VD25OH Last vitamin B12 and Folate No results found for: VITAMINB12, FOLATE     Objective    BP 139/83   Pulse 86   Temp 97.6 F (36.4 C) (Oral)   Resp 16   Ht 5\' 10"  (1.778 m)   Wt 190 lb 9.6 oz (86.5 kg)   BMI 27.35 kg/m  BP Readings from Last 3 Encounters:  09/05/20 139/83  06/07/18 107/76  05/18/18 122/78   Wt Readings from Last 3 Encounters:  09/05/20 190 lb 9.6 oz (86.5 kg)  06/07/18 182 lb (82.6 kg)  05/18/18 188 lb 6.4 oz (85.5 kg)       Physical Exam Vitals reviewed.  Constitutional:      Appearance: Normal appearance. He is not ill-appearing, toxic-appearing or diaphoretic.  HENT:     Head: Normocephalic and atraumatic.     Right Ear: There is no impacted cerumen.     Left Ear: There is no impacted cerumen.     Mouth/Throat:     Mouth: Mucous membranes are moist.  Eyes:     Conjunctiva/sclera: Conjunctivae normal.  Cardiovascular:     Rate  and Rhythm: Normal rate and regular rhythm.     Pulses: Normal pulses.     Heart sounds: Normal heart sounds. No murmur heard. No friction rub. No gallop.   Pulmonary:     Effort: Pulmonary effort is normal. No respiratory distress.     Breath sounds: Normal breath sounds. No stridor. No wheezing, rhonchi or  rales.  Chest:     Chest wall: No tenderness.  Abdominal:     Palpations: Abdomen is soft.  Musculoskeletal:        General: Normal range of motion.     Cervical back: Normal range of motion and neck supple. No rigidity.  Lymphadenopathy:     Cervical: No cervical adenopathy.  Skin:    General: Skin is warm.     Findings: Rash present.       Neurological:     General: No focal deficit present.     Mental Status: He is alert.     Motor: No weakness.     Gait: Gait normal.  Psychiatric:        Mood and Affect: Mood normal.        Behavior: Behavior normal.        Thought Content: Thought content normal.        Judgment: Judgment normal.      No results found for any visits on 09/05/20.  Assessment & Plan     Psoriasis  Dry skin - Plan: triamcinolone (KENALOG) 0.1 %, nystatin ointment (MYCOSTATIN)  Fungal infection of skin  Meds ordered this encounter  Medications  . triamcinolone (KENALOG) 0.1 %    Sig: Apply 1 application topically 2 (two) times daily.    Dispense:  30 g    Refill:  0  . nystatin ointment (MYCOSTATIN)    Sig: Apply 1 application topically 2 (two) times daily.    Dispense:  30 g    Refill:  0    Obtain an emollient lotion to use three times a day as needed.   Red Flags discussed. The patient was given clear instructions to go to ER or return to medical center if any red flags develop, symptoms do not improve, worsen or new problems develop. They verbalized understanding.  He is advised he need to set up physical and labs as overdue was previous patient of Mariel Sleet. He agrees to schedule this.    Return in about 3 weeks (around  09/26/2020), or if symptoms worsen or fail to improve, for at any time for any worsening symptoms, Go to Emergency room/ urgent care if worse.      The entirety of the information documented in the History of Present Illness, Review of Systems and Physical Exam were personally obtained by me. Portions of this information were initially documented by the CMA and reviewed by me for thoroughness and accuracy.      Marcille Buffy, [place] [phone] (phone) [phone] (fax)  [place]

## 2020-09-05 NOTE — Patient Instructions (Signed)
Emollient lotion- Eucerin.      Psoriasis Psoriasis is a long-term (chronic) skin condition. It occurs because your body's defense system (immune system) causes skin cells to form too quickly. This causes raised, red patches (plaques) on your skin that look silvery. The patches may be on all areas of your body. They can be any size or shape. Psoriasis can come and go. It can range from mild to very bad. It cannot be passed from one person to another (is not contagious). There is no cure for this condition, but it can be helped with treatment. What are the causes? The cause of psoriasis is not known. Some things can make it worse. These are:  Skin damage, such as cuts, scrapes, sunburn, and dryness.  Not getting enough sunlight.  Some medicines.  Alcohol.  Tobacco.  Stress.  Infections. What increases the risk?  Having a family member with psoriasis.  Being very overweight (obese).  Being 62-62 years old.  Taking certain medicines. What are the signs or symptoms? There are different types of psoriasis. The types are:  Plaque. This is the most common. Symptoms include red, raised patches with a silvery coating. These may be itchy. Your nails may be crumbly or fall off.  Guttate. Symptoms include small red spots on your stomach area, arms, and legs. These may happen after you have been sick, such as with strep throat.  Inverse. Symptoms include patches in your armpits, under your breasts, private areas, or on your butt.  Pustular. Symptoms include pus-filled bumps on the palms of your hands or the soles of your feet. You also may feel very tired, weak, have a fever, and not be hungry.  Erythrodermic. Symptoms include bright red skin that looks burned. You may have a fast heartbeat and a body temperature that is too high or too low. You may be itchy or in pain.  Sebopsoriasis. Symptoms include red patches on your scalp, forehead, and face that are greasy.  Psoriatic  arthritis. Symptoms include swollen, painful joints along with scaly skin patches.   How is this treated? There is no cure for this condition, but treatment can:  Help your skin heal.  Lessen itching and irritation and swelling (inflammation).  Slow the growth of new skin cells.  Help your body's defense system respond better to your skin. Treatment may include:  Creams or ointments.  Light therapy. This may include natural sunlight or light therapy in a doctor's office.  Medicines. These can help your body better manage skin cells. They may be used with light therapy or ointments. Medicines may include pills or injections. You may also get antibiotic medicines if you have an infection. Follow these instructions at home: Skin Care  Apply lotion to your skin as needed. Only use those that your doctor has said are okay.  Apply cool, wet cloths (cold compresses) to the affected areas.  Do not use a hot tub or take hot showers. Use slightly warm, not hot, water when taking showers and baths.  Do not scratch your skin. Lifestyle  Do not use any products that contain nicotine or tobacco, such as cigarettes, e-cigarettes, and chewing tobacco. If you need help quitting, ask your doctor.  Lower your stress.  Keep a healthy weight.  Go out in the sun as told by your doctor. Do not get sunburned.  Join a support group.   Medicines  Take or use over-the-counter and prescription medicines only as told by your doctor.  If you were  prescribed an antibiotic medicine, take it as told by your doctor. Do not stop using the antibiotic even if you start to feel better. Alcohol use If you drink alcohol:  Limit how much you use: ? 0-1 drink a day for women. ? 0-2 drinks a day for men.  Be aware of how much alcohol is in your drink. In the [place] one drink equals one 12 oz bottle of beer (355 mL), one 5 oz glass of wine (148 mL), or one 1 oz glass of hard liquor (44 mL). General  instructions  Keep a journal to track the things that cause symptoms (triggers). Try to avoid these things.  See a counselor if you feel the support would help.  Keep all follow-up visits as told by your doctor. This is important. Contact a doctor if:  You have a fever.  Your pain gets worse.  You have more redness or warmth in the affected areas.  You have new or worse pain or stiffness in your joints.  Your nails start to break easily or pull away from the nail bed.  You feel very sad (depressed). Summary  Psoriasis is a long-term (chronic) skin condition.  There is no cure for this condition, but treatment can help manage it.  Keep a journal to track the things that cause symptoms.  Take or use over-the-counter and prescription medicines only as told by your doctor.  Keep all follow-up visits as told by your doctor. This is important. This information is not intended to replace advice given to you by your health care provider. Make sure you discuss any questions you have with your health care provider. Document Revised: 06/01/2018 Document Reviewed: 06/01/2018 Elsevier Patient Education  2021 [employer].

## 2023-04-23 ENCOUNTER — Encounter: Payer: Self-pay | Admitting: *Deleted

## 2023-12-17 ENCOUNTER — Telehealth: Payer: Self-pay | Admitting: *Deleted

## 2023-12-17 ENCOUNTER — Other Ambulatory Visit: Payer: Self-pay | Admitting: *Deleted

## 2023-12-17 ENCOUNTER — Telehealth: Payer: Self-pay

## 2023-12-17 DIAGNOSIS — Z860100 Personal history of colon polyps, unspecified: Secondary | ICD-10-CM

## 2023-12-17 MED ORDER — NA SULFATE-K SULFATE-MG SULF 17.5-3.13-1.6 GM/177ML PO SOLN: 1.0000 | Freq: Once | ORAL | 0 refills | Status: AC

## 2023-12-17 NOTE — Telephone Encounter (Signed)
 Colonoscopy schedule on 01/28/2024 with Dr Ole Berkeley

## 2023-12-17 NOTE — Telephone Encounter (Signed)
 Pt requesting call back to schedule colonoscopy.

## 2023-12-17 NOTE — Telephone Encounter (Signed)
 Gastroenterology Pre-Procedure Review  Request Date: 01/28/2024 Requesting Physician: Dr. Ole Berkeley  PATIENT REVIEW QUESTIONS: The patient responded to the following health history questions as indicated:    1. Are you having any GI issues? no 2. Do you have a personal history of Polyps? yes (06/07/2018 with Dr Ole Berkeley) 3. Do you have a family history of Colon Cancer or Polyps? no 4. Diabetes Mellitus? no 5. Joint replacements in the past 12 months?no 6. Major health problems in the past 3 months?no 7. Any artificial heart valves, MVP, or defibrillator?no    MEDICATIONS & ALLERGIES:    Patient reports the following regarding taking any anticoagulation/antiplatelet therapy:   Plavix, Coumadin, Eliquis, Xarelto, Lovenox, Pradaxa, Brilinta, or Effient? no Aspirin? no  Patient confirms/reports the following medications:  Current Outpatient Medications  Medication Sig Dispense Refill   nystatin  ointment (MYCOSTATIN ) Apply 1 application topically 2 (two) times daily. 30 g 0   triamcinolone  (KENALOG ) 0.1 % Apply 1 application topically 2 (two) times daily. 30 g 0   No current facility-administered medications for this visit.    Patient confirms/reports the following allergies:  No Known Allergies  No orders of the defined types were placed in this encounter.   AUTHORIZATION INFORMATION Primary Insurance: 1D#: Group #:  Secondary Insurance: 1D#: Group #:  SCHEDULE INFORMATION: Date: 01/28/2024 Time: Location:  [facility]

## 2024-01-28 ENCOUNTER — Encounter: Payer: Self-pay | Admitting: Gastroenterology

## 2024-01-28 ENCOUNTER — Ambulatory Visit: Admitting: Anesthesiology

## 2024-01-28 ENCOUNTER — Other Ambulatory Visit: Payer: Self-pay

## 2024-01-28 ENCOUNTER — Encounter
Admission: RE | Disposition: A | Discharge status: Home / Self Care | Payer: Self-pay | Source: Home / Self Care | Attending: Gastroenterology

## 2024-01-28 ENCOUNTER — Ambulatory Visit
Admission: RE | Admit: 2024-01-28 | Discharge: 2024-01-28 | Disposition: A | Discharge status: Home / Self Care | Attending: Gastroenterology | Admitting: Gastroenterology

## 2024-01-28 DIAGNOSIS — D124 Benign neoplasm of descending colon: Secondary | ICD-10-CM | POA: Insufficient documentation

## 2024-01-28 DIAGNOSIS — K640 First degree hemorrhoids: Secondary | ICD-10-CM | POA: Insufficient documentation

## 2024-01-28 DIAGNOSIS — Z860100 Personal history of colon polyps, unspecified: Secondary | ICD-10-CM

## 2024-01-28 DIAGNOSIS — Z1211 Encounter for screening for malignant neoplasm of colon: Secondary | ICD-10-CM | POA: Insufficient documentation

## 2024-01-28 DIAGNOSIS — K5730 Diverticulosis of large intestine without perforation or abscess without bleeding: Secondary | ICD-10-CM | POA: Insufficient documentation

## 2024-01-28 DIAGNOSIS — K635 Polyp of colon: Secondary | ICD-10-CM

## 2024-01-28 HISTORY — PX: COLONOSCOPY: SHX5424

## 2024-01-28 SURGERY — COLONOSCOPY
Anesthesia: General

## 2024-01-28 MED ORDER — SODIUM CHLORIDE 0.9 % IV SOLN: INTRAVENOUS | Status: DC

## 2024-01-28 MED ORDER — LIDOCAINE HCL (CARDIAC) PF 100 MG/5ML IV SOSY
PREFILLED_SYRINGE | INTRAVENOUS | Status: DC | PRN
  Administered 2024-01-28: 50 mg via INTRAVENOUS

## 2024-01-28 MED ORDER — PROPOFOL 500 MG/50ML IV EMUL
INTRAVENOUS | Status: DC | PRN
  Administered 2024-01-28: 150 ug/kg/min via INTRAVENOUS

## 2024-01-28 MED ORDER — PHENYLEPHRINE 80 MCG/ML (10ML) SYRINGE FOR IV PUSH (FOR BLOOD PRESSURE SUPPORT)
PREFILLED_SYRINGE | INTRAVENOUS | Status: DC | PRN
  Administered 2024-01-28: 80 ug via INTRAVENOUS

## 2024-01-28 MED ORDER — PROPOFOL 10 MG/ML IV BOLUS
INTRAVENOUS | Status: DC | PRN
  Administered 2024-01-28: 70 mg via INTRAVENOUS

## 2024-01-28 NOTE — Anesthesia Preprocedure Evaluation (Addendum)
 Anesthesia Evaluation  Patient identified by MRN, date of birth, ID band Patient awake    Reviewed: Allergy & Precautions, H&P , NPO status , Patient's Chart, lab work & pertinent test results  Airway Mallampati: II  TM Distance: >3 FB Neck ROM: full    Dental no notable dental hx.    Pulmonary neg pulmonary ROS   Pulmonary exam normal        Cardiovascular negative cardio ROS Normal cardiovascular exam     Neuro/Psych negative neurological ROS  negative psych ROS   GI/Hepatic negative GI ROS, Neg liver ROS,,,  Endo/Other  negative endocrine ROS    Renal/GU negative Renal ROS  negative genitourinary   Musculoskeletal   Abdominal Normal abdominal exam  (+)   Peds  Hematology negative hematology ROS (+)   Anesthesia Other Findings Past Surgical History: 06/07/2018: COLONOSCOPY WITH PROPOFOL ; N/A     Comment:  Procedure: COLONOSCOPY WITH BIOPSIES;  Surgeon: [name], MD;  Location: [facility];  Service:               Endoscopy;  Laterality: N/A; No date: NO PAST SURGERIES 06/07/2018: POLYPECTOMY; N/A     Comment:  Procedure: POLYPECTOMY;  Surgeon: [name], MD;                Location: [facility];  Service: Endoscopy;                Laterality: N/A;     Reproductive/Obstetrics negative OB ROS                             Anesthesia Physical Anesthesia Plan  ASA: 2  Anesthesia Plan: General   Post-op Pain Management:    Induction:   PONV Risk Score and Plan: Propofol  infusion and TIVA  Airway Management Planned: Natural Airway  Additional Equipment:   Intra-op Plan:   Post-operative Plan:   Informed Consent: I have reviewed the patients History and Physical, chart, labs and discussed the procedure including the risks, benefits and alternatives for the proposed anesthesia with the patient or authorized representative who has indicated  his/her understanding and acceptance.     Dental Advisory Given  Plan Discussed with: CRNA and Surgeon  Anesthesia Plan Comments:         Anesthesia Quick Evaluation

## 2024-01-28 NOTE — H&P (Signed)
 Kevin Sink, MD [facility] [address] [place], [place] [address] Phone:[phone] Fax : [phone]  Primary Care Physician:  Kevin Barters, MD Primary Gastroenterologist:  Kevin Terry  Pre-Procedure History & Physical: HPI:  Kevin Terry is a 63 y.o. male is here for an colonoscopy.   Past Medical History:  Diagnosis Date   Medical history non-contributory     Past Surgical History:  Procedure Laterality Date   COLONOSCOPY WITH PROPOFOL  N/A 06/07/2018   Procedure: COLONOSCOPY WITH BIOPSIES;  Surgeon: Kevin Sink, MD;  Location: [facility];  Service: Endoscopy;  Laterality: N/A;   NO PAST SURGERIES     POLYPECTOMY N/A 06/07/2018   Procedure: POLYPECTOMY;  Surgeon: Kevin Sink, MD;  Location: [facility];  Service: Endoscopy;  Laterality: N/A;    Prior to Admission medications   Medication Sig Start Date End Date Taking? Authorizing Provider  nystatin  ointment (MYCOSTATIN ) Apply 1 application topically 2 (two) times daily. Patient not taking: Reported on 01/28/2024 09/05/20   Flinchum, Charlton Cooler, FNP  triamcinolone  (KENALOG ) 0.1 % Apply 1 application topically 2 (two) times daily. Patient not taking: Reported on 01/28/2024 09/05/20   Britton Cane, FNP    Allergies as of 12/17/2023   (No Known Allergies)    Family History  Problem Relation Age of Onset   Alzheimer's disease Mother    Parkinson's disease Father    Heart disease Father    Obesity Brother     Social History   Socioeconomic History   Marital status: Married    Spouse name: Not on file   Number of children: Not on file   Years of education: Not on file   Highest education level: Not on file  Occupational History   Not on file  Tobacco Use   Smoking status: Never   Smokeless tobacco: Never   Tobacco comments:    tried as a teenager  Vaping Use   Vaping status: Never Used  Substance and Sexual Activity   Alcohol use: No    Alcohol/week: 0.0  standard drinks of alcohol   Drug use: No   Sexual activity: Not on file  Other Topics Concern   Not on file  Social History Narrative   Not on file   Social Drivers of Health   Financial Resource Strain: Low Risk  (03/05/2023)   Received from [facility]   Overall Financial Resource Strain (CARDIA)    Difficulty of Paying Living Expenses: Not hard at all  Food Insecurity: No Food Insecurity (03/05/2023)   Received from [facility]   Hunger Vital Sign    Within the past 12 months, you worried that your food would run out before you got the money to buy more.: Never true    Within the past 12 months, the food you bought just didn't last and you didn't have money to get more.: Never true  Transportation Needs: No Transportation Needs (03/05/2023)   Received from [facility] - Transportation    In the past 12 months, has lack of transportation kept you from medical appointments or from getting medications?: No    Lack of Transportation (Non-Medical): No  Physical Activity: Sufficiently Active (03/05/2023)   Received from [facility]   Exercise Vital Sign    On average, how many days per week do you engage in moderate to strenuous exercise (like a brisk walk)?: 5 days    On average, how  many minutes do you engage in exercise at this level?: 60 min  Stress: Not on file  Social Connections: Not on file  Intimate Partner Violence: Not on file    Review of Systems: See HPI, otherwise negative ROS  Physical Exam: BP 128/86   Pulse 74   Temp (!) 96.7 F (35.9 C) (Temporal)   Resp 18   Ht 5' 10 (1.778 m)   Wt 78 kg   SpO2 98%   BMI 24.68 kg/m  General:   Alert,  pleasant and cooperative in NAD Head:  Normocephalic and atraumatic. Neck:  Supple; no masses or thyromegaly. Lungs:  Clear throughout to auscultation.    Heart:  Regular rate and rhythm. Abdomen:  Soft, nontender and nondistended. Normal  bowel sounds, without guarding, and without rebound.   Neurologic:  Alert and  oriented x4;  grossly normal neurologically.  Impression/Plan: [name] is here for an colonoscopy to be performed for a history of adenomatous polyps on 2019   Risks, benefits, limitations, and alternatives regarding  colonoscopy have been reviewed with the patient.  Questions have been answered.  All parties agreeable.   Kevin Sink, MD  01/28/2024, 9:11 AM

## 2024-01-28 NOTE — Op Note (Signed)
 [facility] Gastroenterology Patient Name: Kevin Terry Procedure Date: 01/28/2024 9:39 AM MRN: [id number] Account #: [id number] Date of Birth: 23-Jan-1961 Admit Type: Outpatient Age: 63 Room: [facility] ENDO ROOM 3 Gender: Male Note Status: Finalized Instrument Name: Charlyn Cooley [id number] Procedure:             Colonoscopy Indications:           High risk colon cancer surveillance: Personal history                         of colonic polyps Providers:             [name] MD, MD Referring MD:          Aldo Amble. Oletta Berry, MD (Referring MD) Medicines:             Propofol  per Anesthesia Complications:         No immediate complications. Procedure:             Pre-Anesthesia Assessment:                        - Prior to the procedure, a History and Physical was                         performed, and patient medications and allergies were                         reviewed. The patient's tolerance of previous                         anesthesia was also reviewed. The risks and benefits                         of the procedure and the sedation options and risks                         were discussed with the patient. All questions were                         answered, and informed consent was obtained. Prior                         Anticoagulants: The patient has taken no anticoagulant                         or antiplatelet agents. ASA Grade Assessment: II - A                         patient with mild systemic disease. After reviewing                         the risks and benefits, the patient was deemed in                         satisfactory condition to undergo the procedure.                        After obtaining informed consent, the colonoscope was  passed under direct vision. Throughout the procedure,                         the patient's blood pressure, pulse, and oxygen                         saturations were monitored continuously. The                          Colonoscope was introduced through the anus and                         advanced to the the cecum, identified by appendiceal                         orifice and ileocecal valve. The colonoscopy was                         performed without difficulty. The patient tolerated                         the procedure well. The quality of the bowel                         preparation was good. Findings:      The perianal and digital rectal examinations were normal.      A 4 mm polyp was found in the ascending colon. The polyp was sessile.       The polyp was removed with a cold snare. Resection and retrieval were       complete.      A 4 mm polyp was found in the descending colon. The polyp was sessile.       The polyp was removed with a cold snare. Resection and retrieval were       complete.      A few small-mouthed diverticula were found in the entire colon.      Non-bleeding internal hemorrhoids were found during retroflexion. The       hemorrhoids were Grade I (internal hemorrhoids that do not prolapse). Impression:            - One 4 mm polyp in the ascending colon, removed with                         a cold snare. Resected and retrieved.                        - One 4 mm polyp in the descending colon, removed with                         a cold snare. Resected and retrieved.                        - Diverticulosis in the entire examined colon.                        - Non-bleeding internal hemorrhoids. Recommendation:        - Discharge patient to home.                        -  Resume previous diet.                        - Continue present medications.                        - Await pathology results.                        - If the pathology report reveals adenomatous tissue,                         then repeat the colonoscopy for surveillance in 5                         years. Procedure Code(s):     --- Professional ---                        [phone],  Colonoscopy, flexible; with removal of                         tumor(s), polyp(s), or other lesion(s) by snare                         technique Diagnosis Code(s):     --- Professional ---                        Z86.010, Personal history of colonic polyps                        D12.4, Benign neoplasm of descending colon CPT copyright 2022 American Medical Association. All rights reserved. The codes documented in this report are preliminary and upon coder review may  be revised to meet current compliance requirements. [name] MD, MD 01/28/2024 10:01:43 AM This report has been signed electronically. Number of Addenda: 0 Note Initiated On: 01/28/2024 9:39 AM Scope Withdrawal Time: 0 hours 6 minutes 1 second  Total Procedure Duration: 0 hours 10 minutes 7 seconds  Estimated Blood Loss:  Estimated blood loss: none.      [facility]

## 2024-01-28 NOTE — Anesthesia Postprocedure Evaluation (Signed)
 Anesthesia Post Note  Patient: Ayush Boulet  Procedure(s) Performed: COLONOSCOPY  Patient location during evaluation: Endoscopy Anesthesia Type: General Level of consciousness: awake and alert Pain management: pain level controlled Vital Signs Assessment: post-procedure vital signs reviewed and stable Respiratory status: spontaneous breathing, nonlabored ventilation and respiratory function stable Cardiovascular status: blood pressure returned to baseline and stable Postop Assessment: no apparent nausea or vomiting Anesthetic complications: no   No notable events documented.   Last Vitals:  Vitals:   01/28/24 1020 01/28/24 1022  BP: 102/77 95/71  Pulse: 81 71  Resp: 15 13  Temp:    SpO2: 99% 99%    Last Pain:  Vitals:   01/28/24 1022  TempSrc:   PainSc: 0-No pain                 Baltazar Bonier

## 2024-01-28 NOTE — Transfer of Care (Signed)
 Immediate Anesthesia Transfer of Care Note  Patient: Kevin Terry  Procedure(s) Performed: COLONOSCOPY  Patient Location: Endoscopy Unit  Anesthesia Type:General  Level of Consciousness: drowsy and patient cooperative  Airway & Oxygen Therapy: Patient Spontanous Breathing and Patient connected to nasal cannula oxygen  Post-op Assessment: Report given to RN, Post -op Vital signs reviewed and stable, and Patient moving all extremities X 4  Post vital signs: Reviewed and stable  Last Vitals:  Vitals Value Taken Time  BP    Temp    Pulse    Resp    SpO2      Last Pain:  Vitals:   01/28/24 0905  TempSrc: Temporal  PainSc: 0-No pain         Complications: No notable events documented.

## 2024-01-29 ENCOUNTER — Encounter: Payer: Self-pay | Admitting: Gastroenterology

## 2024-01-29 LAB — SURGICAL PATHOLOGY

## 2024-02-01 ENCOUNTER — Ambulatory Visit: Payer: Self-pay | Admitting: Gastroenterology

## 2024-03-22 ENCOUNTER — Encounter: Payer: Self-pay | Admitting: Dermatology

## 2024-03-22 ENCOUNTER — Ambulatory Visit: Admitting: Dermatology

## 2024-03-22 DIAGNOSIS — L814 Other melanin hyperpigmentation: Secondary | ICD-10-CM

## 2024-03-22 DIAGNOSIS — L820 Inflamed seborrheic keratosis: Secondary | ICD-10-CM | POA: Diagnosis not present

## 2024-03-22 DIAGNOSIS — D485 Neoplasm of uncertain behavior of skin: Secondary | ICD-10-CM

## 2024-03-22 DIAGNOSIS — Z1283 Encounter for screening for malignant neoplasm of skin: Secondary | ICD-10-CM | POA: Diagnosis not present

## 2024-03-22 DIAGNOSIS — L578 Other skin changes due to chronic exposure to nonionizing radiation: Secondary | ICD-10-CM

## 2024-03-22 DIAGNOSIS — C44319 Basal cell carcinoma of skin of other parts of face: Secondary | ICD-10-CM | POA: Diagnosis not present

## 2024-03-22 DIAGNOSIS — W908XXA Exposure to other nonionizing radiation, initial encounter: Secondary | ICD-10-CM | POA: Diagnosis not present

## 2024-03-22 DIAGNOSIS — D692 Other nonthrombocytopenic purpura: Secondary | ICD-10-CM

## 2024-03-22 DIAGNOSIS — D489 Neoplasm of uncertain behavior, unspecified: Secondary | ICD-10-CM

## 2024-03-22 DIAGNOSIS — R42 Dizziness and giddiness: Secondary | ICD-10-CM

## 2024-03-22 NOTE — Patient Instructions (Addendum)

## 2024-03-22 NOTE — Progress Notes (Unsigned)
 New Patient Visit   Subjective  Kevin Terry is a 63 y.o. male who presents for the following: Skin Cancer Screening and Full Body Skin Exam Patient denies family or personal history of skin cancer  The patient presents for Total-Body Skin Exam (TBSE) for skin cancer screening and mole check. The patient has spots, moles and lesions to be evaluated, some may be new or changing and the patient may have concern these could be cancer.  The following portions of the chart were reviewed this encounter and updated as appropriate: medications, allergies, medical history  Review of Systems:  No other skin or systemic complaints except as noted in HPI or Assessment and Plan.  Objective  Well appearing patient in no apparent distress; mood and affect are within normal limits.  A full examination was performed including scalp, head, eyes, ears, nose, lips, neck, chest, axillae, abdomen, back, buttocks, bilateral upper extremities, bilateral lower extremities, hands, feet, fingers, toes, fingernails, and toenails. All findings within normal limits unless otherwise noted below.   Relevant physical exam findings are noted in the Assessment and Plan.  Midline Forehead 0.7 cm Pearly papule   left posterior shoulder x 2, right anterior shoulder x 4 (6) Erythematous stuck-on, waxy papule or plaque  Assessment & Plan   SKIN CANCER SCREENING PERFORMED TODAY.  ACTINIC DAMAGE - Chronic condition, secondary to cumulative UV/sun exposure - diffuse scaly erythematous macules with underlying dyspigmentation - Recommend daily broad spectrum sunscreen SPF 30+ to sun-exposed areas, reapply every 2 hours as needed.  - Staying in the shade or wearing long sleeves, sun glasses (UVA+UVB protection) and wide brim hats (4-inch brim around the entire circumference of the hat) are also recommended for sun protection.  - Call for new or changing lesions.  LENTIGINES, SEBORRHEIC KERATOSES, HEMANGIOMAS - Benign  normal skin lesions - Benign-appearing - Call for any changes  MELANOCYTIC NEVI - Tan-brown and/or pink-flesh-colored symmetric macules and papules - Benign appearing on exam today - Observation - Call clinic for new or changing moles - Recommend daily use of broad spectrum spf 30+ sunscreen to sun-exposed areas.   Purpura - Chronic; persistent and recurrent.  Treatable, but not curable. - Violaceous macules and patches - Benign - Related to trauma, age, sun damage and/or use of blood thinners, chronic use of topical and/or oral steroids - Observe - Can use OTC arnica containing moisturizer such as Dermend Bruise Formula if desired - Call for worsening or other concerns  Light headedness - Psychogenic response to procedure/anxiety Patient complained of light headedness today during biopsy of spot at mid forehead. Patient was positioned in Trendelenburg position. Biopsy preformed and patient stated he felt ok. During bandaging wound and positioning patient in upright position patient complained of feeling funny. Tried to obtain BP but patient proceeded to pass out while sitting up. Immediately position patient in Trendelenburg position for over 30 minutes. BP was obtained while laying down in right arm as 90/53 p 62,  and again 20 minutes later at 144/74 P 63.   During that time while patient was being cared for he did report a history of passing out at the site of blood and during very stressful events  Patient appt  at 8:15 and did not leave until 9:45 am  Pt was watched the entire time during his difficulties confirming how he felt and taking BP and P.   Patient was given water , juice, peanut butter cracker and chips during the duration.   Patient was not allowed  to leave until noticed he was sitting with good blood pressure, talking, drank a bottle of water , went to bathroom and was observed leaving ok.  115/70 63 P in left arm   NEOPLASM OF UNCERTAIN BEHAVIOR Midline  Forehead Epidermal / dermal shaving  Lesion diameter (cm):  0.7 Informed consent: discussed and consent obtained   Timeout: patient name, date of birth, surgical site, and procedure verified   Procedure prep:  Patient was prepped and draped in usual sterile fashion Prep type:  Isopropyl alcohol Anesthesia: the lesion was anesthetized in a standard fashion   Anesthetic:  1% lidocaine  w/ epinephrine 1-100,000 buffered w/ 8.4% NaHCO3 Instrument used: flexible razor blade   Hemostasis achieved with: pressure, aluminum chloride and electrodesiccation   Outcome: patient tolerated procedure well   Post-procedure details: sterile dressing applied and wound care instructions given   Dressing type: bandage and petrolatum    Destruction of lesion Complexity: extensive   Destruction method: electrodesiccation and curettage   Informed consent: discussed and consent obtained   Timeout:  patient name, date of birth, surgical site, and procedure verified Procedure prep:  Patient was prepped and draped in usual sterile fashion Prep type:  Isopropyl alcohol Anesthesia: the lesion was anesthetized in a standard fashion   Anesthetic:  1% lidocaine  w/ epinephrine 1-100,000 buffered w/ 8.4% NaHCO3 Curettage performed in three different directions: Yes   Electrodesiccation performed over the curetted area: Yes   Lesion length (cm):  0.7 Lesion width (cm):  0.7 Margin per side (cm):  0.2 Final wound size (cm):  1.1 Hemostasis achieved with:  pressure, aluminum chloride and electrodesiccation Outcome: patient tolerated procedure well with no complications   Post-procedure details: sterile dressing applied and wound care instructions given   Dressing type: bandage and petrolatum    Specimen 1 - Surgical pathology Differential Diagnosis: r/o bcc ED&C done today  Some tissue from irregular textured skin above location of biopsy at midline forehead was taken today and sent off for testing as well Check  Margins: No R/o bcc  ED&C today   Showing a little irregular text located above the site but will address at later time after pathology results   Some tissue from irregular textured skin above location of biopsy was taken today and sent off for testing as well INFLAMED SEBORRHEIC KERATOSIS (6) left posterior shoulder x 2, right anterior shoulder x 4 (6) Symptomatic, irritating, patient would like treated. Destruction of lesion - left posterior shoulder x 2, right anterior shoulder x 4 (6) Complexity: simple   Destruction method: cryotherapy   Informed consent: discussed and consent obtained   Timeout:  patient name, date of birth, surgical site, and procedure verified Lesion destroyed using liquid nitrogen: Yes   Region frozen until ice ball extended beyond lesion: Yes   Outcome: patient tolerated procedure well with no complications   Post-procedure details: wound care instructions given    ACTINIC SKIN DAMAGE   SKIN CANCER SCREENING   LENTIGO   PURPURA (HCC)   LIGHT HEADEDNESS    90 minutes spent in evaluation and treatment of patient today.   Return in about 1 year (around 03/22/2025) for TBSE.  IEleanor Blush, CMA, am acting as scribe for Alm Rhyme, MD.   Documentation: I have reviewed the above documentation for accuracy and completeness, and I agree with the above.  Alm Rhyme, MD

## 2024-03-24 DIAGNOSIS — C4491 Basal cell carcinoma of skin, unspecified: Secondary | ICD-10-CM

## 2024-03-24 HISTORY — DX: Basal cell carcinoma of skin, unspecified: C44.91

## 2024-03-28 ENCOUNTER — Ambulatory Visit: Payer: Self-pay | Admitting: Dermatology

## 2024-03-28 ENCOUNTER — Encounter: Payer: Self-pay | Admitting: Dermatology

## 2024-03-28 LAB — SURGICAL PATHOLOGY

## 2024-03-28 NOTE — Telephone Encounter (Addendum)
 Called and discussed results with patient. He verbalized understanding and denied further questions. Patient states his wife will call back tomorrow to schedule the 2 to 3 month follow up.  ----- Message from Alm Rhyme sent at 03/28/2024  5:38 PM EDT ----- FINAL DIAGNOSIS        1. Skin, midline forehead :       BASAL CELL CARCINOMA, MICRONODULAR PATTERN   Cancer = BCC Already treated Make pt appt for 2-3 mos for re-check (may consider adjunct treatment with topical chemo cream) ----- Message ----- From: Interface, Lab In [facility] Sent: 03/28/2024   4:21 PM EDT To: Alm JAYSON Rhyme, MD

## 2024-05-10 ENCOUNTER — Ambulatory Visit: Admitting: Dermatology

## 2024-05-23 ENCOUNTER — Ambulatory Visit (INDEPENDENT_AMBULATORY_CARE_PROVIDER_SITE_OTHER): Admitting: Dermatology

## 2024-05-23 DIAGNOSIS — Z5111 Encounter for antineoplastic chemotherapy: Secondary | ICD-10-CM

## 2024-05-23 DIAGNOSIS — C44319 Basal cell carcinoma of skin of other parts of face: Secondary | ICD-10-CM | POA: Diagnosis not present

## 2024-05-23 DIAGNOSIS — Z79899 Other long term (current) drug therapy: Secondary | ICD-10-CM

## 2024-05-23 DIAGNOSIS — Z7189 Other specified counseling: Secondary | ICD-10-CM

## 2024-05-23 MED ORDER — FLUOROURACIL 5 % EX CREA: TOPICAL_CREAM | Freq: Two times a day (BID) | CUTANEOUS | 1 refills | Status: AC

## 2024-05-23 NOTE — Progress Notes (Unsigned)
   Follow-Up Visit   Subjective  Kevin Terry is a 63 y.o. male who presents for the following: recheck BCC midline forehead, bx and EDC 03/22/24  The following portions of the chart were reviewed this encounter and updated as appropriate: medications, allergies, medical history  Review of Systems:  No other skin or systemic complaints except as noted in HPI or Assessment and Plan.  Objective  Well appearing patient in no apparent distress; mood and affect are within normal limits.  A focused examination was performed of the following areas: face  Relevant exam findings are noted in the Assessment and Plan.    Assessment & Plan   BASAL CELL CARCINOMA Bx and EDC 03/22/24 Midline forehead Exam: pink bx site  Treatment Plan: Start 5FU/Calcipotriene cr bid to aa forehead for 2 weeks  5-fluorouracil/calcipotriene cream is is a type of field treatment used to treat precancers, thin skin cancers, and areas of sun damage. Expected reaction includes irritation and mild inflammation potentially progressing to more severe inflammation including redness, scaling, crusting and open sores/erosions.  If too much irritation occurs, ensure application of only a thin layer and decrease frequency of use to achieve a tolerable level of inflammation. Recommend applying Vaseline ointment to open sores as needed.  Minimize sun exposure while under treatment. Recommend daily broad spectrum sunscreen SPF 30+ to sun-exposed areas, reapply every 2 hours as needed.     Return for as schedueld for TBSE, Hx of BCC.  I, Grayce Saunas, RMA, am acting as scribe for Alm Rhyme, MD .   Documentation: I have reviewed the above documentation for accuracy and completeness, and I agree with the above.  Alm Rhyme, MD

## 2024-05-23 NOTE — Patient Instructions (Signed)
 Start 5FU/Calcipotriene cream twice a day for 2 weeks.  The prescription has been sent to [facility] and they will mail you the prescription.  Instructions for [facility] Medications  One or more of your medications was sent to the [facility] mail order compounding pharmacy. You will receive an email from them and can purchase the medicine through that link. It will then be mailed to your home at the address you confirmed. If for any reason you do not receive an email from them, please check your spam folder. If you still do not find the email, please let us  know. [facility] phone number is [phone].   Due to recent changes in healthcare laws, you may see results of your pathology and/or laboratory studies on MyChart before the doctors have had a chance to review them. We understand that in some cases there may be results that are confusing or concerning to you. Please understand that not all results are received at the same time and often the doctors may need to interpret multiple results in order to provide you with the best plan of care or course of treatment. Therefore, we ask that you please give us  2 business days to thoroughly review all your results before contacting the office for clarification. Should we see a critical lab result, you will be contacted sooner.   If You Need Anything After Your Visit  If you have any questions or concerns for your doctor, please call our main line at [phone] and press option 4 to reach your doctor's medical assistant. If no one answers, please leave a voicemail as directed and we will return your call as soon as possible. Messages left after 4 pm will be answered the following business day.   You may also send us  a message via MyChart. We typically respond to MyChart messages within 1-2 business days.  For prescription refills, please ask your pharmacy to contact our office. Our fax number is [phone].  If you have an urgent  issue when the clinic is closed that cannot wait until the next business day, you can page your doctor at the number below.    Please note that while we do our best to be available for urgent issues outside of office hours, we are not available 24/7.   If you have an urgent issue and are unable to reach us , you may choose to seek medical care at your doctor's office, retail clinic, urgent care center, or emergency room.  If you have a medical emergency, please immediately call 911 or go to the emergency department.  Pager Numbers  - Dr. Hester: [phone]  - Dr. Jackquline: [phone]  - Dr. Claudene: [phone]   - Dr. Raymund: [phone]  In the event of inclement weather, please call our main line at [phone] for an update on the status of any delays or closures.  Dermatology Medication Tips: Please keep the boxes that topical medications come in in order to help keep track of the instructions about where and how to use these. Pharmacies typically print the medication instructions only on the boxes and not directly on the medication tubes.   If your medication is too expensive, please contact our office at [phone] option 4 or send us  a message through MyChart.   We are unable to tell what your co-pay for medications will be in advance as this is different depending on your insurance coverage. However, we may be able to find a substitute medication at lower cost  or fill out paperwork to get insurance to cover a needed medication.   If a prior authorization is required to get your medication covered by your insurance company, please allow us  1-2 business days to complete this process.  Drug prices often vary depending on where the prescription is filled and some pharmacies may offer cheaper prices.  The website www.goodrx.com contains coupons for medications through different pharmacies. The prices here do not account for what the cost may be with help from insurance (it  may be cheaper with your insurance), but the website can give you the price if you did not use any insurance.  - You can print the associated coupon and take it with your prescription to the pharmacy.  - You may also stop by our office during regular business hours and pick up a GoodRx coupon card.  - If you need your prescription sent electronically to a different pharmacy, notify our office through [facility] or by phone at [phone] option 4.     Si Usted Necesita Algo Despus de Su Visita  Tambin puede enviarnos un mensaje a travs de Clinical cytogeneticist. Por lo general respondemos a los mensajes de MyChart en el transcurso de 1 a 2 das hbiles.  Para renovar recetas, por favor pida a su farmacia que se ponga en contacto con nuestra oficina. Randi lakes de fax es [place] [phone].  Si tiene un asunto urgente cuando la clnica est cerrada y que no puede esperar hasta el siguiente da hbil, puede llamar/localizar a su doctor(a) al nmero que aparece a continuacin.   Por favor, tenga en cuenta que aunque hacemos todo lo posible para estar disponibles para asuntos urgentes fuera del horario de [place], no estamos disponibles las 24 horas del da, los 7 [address]  [address] de la [place].   Si tiene un problema urgente y no puede comunicarse con nosotros, puede optar por buscar atencin mdica  en el consultorio de su doctor(a), en una clnica privada, en un centro de atencin urgente o en una sala de emergencias.  Si tiene Engineer, drilling, por favor llame inmediatamente al 911 o vaya a la sala de emergencias.  Nmeros de bper  - Dr. Hester: [phone]  - Dra. Jackquline: [phone]  - Dr. Claudene: [phone]  - Dra. Kitts: [phone]  En caso de inclemencias del [place], por favor llame a nuestra lnea principal al [phone] para una actualizacin sobre el estado de cualquier retraso o cierre.  Consejos para la medicacin en dermatologa: Por favor, guarde las cajas en las que  vienen los medicamentos de uso tpico para ayudarle a seguir las instrucciones sobre dnde y cmo usarlos. Las farmacias generalmente imprimen las instrucciones del medicamento slo en las cajas y no directamente en los tubos del [address].   Si su medicamento es muy caro, por favor, pngase en contacto con landry rieger llamando al [phone] y presione la opcin 4 o envenos un mensaje a travs de Clinical cytogeneticist.   No podemos decirle cul ser su copago por los medicamentos por adelantado ya que esto es diferente dependiendo de la cobertura de su seguro. Sin embargo, es posible que podamos encontrar un medicamento sustituto a Audiological scientist un formulario para que el seguro cubra el medicamento que se considera necesario.   Si se requiere una autorizacin previa para que su compaa de seguros malta su medicamento, por favor permtanos de 1 a 2 das hbiles para completar este proceso.  Los precios de los medicamentos varan con frecuencia dependiendo del Environmental consultant  de dnde se surte la receta y alguna farmacias pueden ofrecer precios ms baratos.  El sitio web www.goodrx.com tiene cupones para medicamentos de Health and safety inspector. Los precios aqu no tienen en cuenta lo que podra costar con la ayuda del seguro (puede ser ms barato con su seguro), pero el sitio web puede darle el precio si no utiliz Tourist information centre manager.  - Puede imprimir el cupn correspondiente y llevarlo con su receta a la farmacia.  - Tambin puede pasar por nuestra oficina durante el horario de atencin regular y Education officer, museum una tarjeta de cupones de GoodRx.  - Si necesita que su receta se enve electrnicamente a una farmacia diferente, informe a nuestra oficina a travs de MyChart de  o por telfono llamando al [phone] y presione la opcin 4.

## 2024-05-24 ENCOUNTER — Encounter: Payer: Self-pay | Admitting: Dermatology

## 2025-03-23 ENCOUNTER — Ambulatory Visit: Admitting: Dermatology
